# Patient Record
Sex: Male | Born: 1950 | Race: White | Hispanic: No | Marital: Married | Smoking: Never smoker
Health system: Southern US, Community
[De-identification: ages and names within clinical notes are randomized; demographics above are authoritative.]

## PROBLEM LIST (undated history)

## (undated) DIAGNOSIS — R339 Retention of urine, unspecified: Secondary | ICD-10-CM

## (undated) DIAGNOSIS — N35919 Unspecified urethral stricture, male, unspecified site: Secondary | ICD-10-CM

## (undated) DIAGNOSIS — R3912 Poor urinary stream: Secondary | ICD-10-CM

## (undated) DIAGNOSIS — R351 Nocturia: Secondary | ICD-10-CM

## (undated) HISTORY — PX: CYSTOSCOPY W/ INTERNAL URETHROTOMY: SUR376

---

## 2004-07-10 ENCOUNTER — Emergency Department (HOSPITAL_COMMUNITY): Admission: EM | Admit: 2004-07-10 | Discharge: 2004-07-10 | Payer: Self-pay | Admitting: Family Medicine

## 2006-07-22 ENCOUNTER — Encounter: Admission: RE | Admit: 2006-07-22 | Discharge: 2006-07-22 | Payer: Self-pay | Admitting: Urology

## 2006-07-24 ENCOUNTER — Ambulatory Visit (HOSPITAL_BASED_OUTPATIENT_CLINIC_OR_DEPARTMENT_OTHER): Admission: RE | Admit: 2006-07-24 | Discharge: 2006-07-24 | Payer: Self-pay | Admitting: Urology

## 2008-09-22 ENCOUNTER — Emergency Department (HOSPITAL_COMMUNITY): Admission: EM | Admit: 2008-09-22 | Discharge: 2008-09-22 | Payer: Self-pay | Admitting: Emergency Medicine

## 2008-12-03 ENCOUNTER — Emergency Department (HOSPITAL_COMMUNITY): Admission: EM | Admit: 2008-12-03 | Discharge: 2008-12-03 | Payer: Self-pay | Admitting: Emergency Medicine

## 2010-10-06 NOTE — Op Note (Signed)
NAME:  Darren Young, CHAVARIN NO.:  [id number]   MEDICAL RECORD NO.:  [id number]          PATIENT TYPE:  AMB   LOCATION:  [facility]                         FACILITY:  [facility]   PHYSICIAN:  Courtney Paris, M.D.DATE OF BIRTH:  07-04-1950   DATE OF PROCEDURE:  07/24/2006  DATE OF DISCHARGE:                               OPERATIVE REPORT   PREOPERATIVE DIAGNOSIS:  Urethral stricture - recurrent.   POSTOPERATIVE DIAGNOSIS:  Urethral stricture - recurrent.   OPERATION:  Direct visual internal urethrotomy.   ANESTHESIA:  General.   SURGEON:  Dr. Vic Blackbird.   BRIEF HISTORY:  This 60 year old patient is admitted with a recurrent  urethral stricture.  I did an internal urethrotomy and around 1996.  He  has had a slow stream in the past year. We are not sure exactly how and  why he got the stricture but it has responded well to the treatment he  had 10 years ago.  He has also had decreased erections for the last 6-12  months.  Cysto at the end of December showed a deep bulbous urethral  stricture and he enters now to have this incised at this time.   The patient was placed on the operating table in the dorsal lithotomy  position and after satisfactory induction of general anesthesia was  prepped and draped with Betadine and was given IV Ancef.  The 22 direct  vision urethrotome was then used to inspect the anterior urethra.  Pictures were made of the stricture in the deep bulbous urethra with a  #3 ureteral catheter going through it.  At 12 o'clock under direct  vision, the stricture was incised, it was about 1-1/2 to 2 cm in length  and went down to and close to the external sphincter.  The scope was  then inserted into the bladder. The bladder was carefully inspected and  it was at least 2+ trabeculated but had no bladder mucosal lesions seen.  The trigone and orifices looked normal.  Backing the scope out, I again  incised the stricture at 12 o'clock. There was very  little bleeding and  it seemed to be adequately incised and opened up.  Pictures were made of  the completion and the scope removed.  He voided with a good stream.  A  #20 Foley catheter easily went in without difficulty and was left to a  straight drainage bag.  Will plan to leave the catheter for 2 weeks and  will come back the day after he removes the catheter on the 20th for  further follow-up and treatment.      Courtney Paris, M.D.  Electronically Signed     HMK/MEDQ  D:  07/24/2006  T:  07/24/2006  Job:  [id number]

## 2012-08-14 ENCOUNTER — Other Ambulatory Visit: Payer: Self-pay | Admitting: Urology

## 2012-09-23 ENCOUNTER — Encounter (HOSPITAL_BASED_OUTPATIENT_CLINIC_OR_DEPARTMENT_OTHER): Payer: Self-pay | Admitting: *Deleted

## 2012-09-23 NOTE — Progress Notes (Signed)
NPO AFTER MN. ARRIVES AT 0715. NEEDS HG. 

## 2012-09-26 ENCOUNTER — Encounter (HOSPITAL_BASED_OUTPATIENT_CLINIC_OR_DEPARTMENT_OTHER): Payer: Self-pay | Admitting: *Deleted

## 2012-09-26 ENCOUNTER — Encounter (HOSPITAL_BASED_OUTPATIENT_CLINIC_OR_DEPARTMENT_OTHER)
Admission: RE | Disposition: A | Discharge status: Home / Self Care | Payer: Self-pay | Source: Ambulatory Visit | Attending: Urology

## 2012-09-26 ENCOUNTER — Ambulatory Visit (HOSPITAL_BASED_OUTPATIENT_CLINIC_OR_DEPARTMENT_OTHER)
Admission: RE | Admit: 2012-09-26 | Discharge: 2012-09-26 | Disposition: A | Discharge status: Home / Self Care | Payer: 59 | Source: Ambulatory Visit | Attending: Urology | Admitting: Urology

## 2012-09-26 ENCOUNTER — Ambulatory Visit (HOSPITAL_BASED_OUTPATIENT_CLINIC_OR_DEPARTMENT_OTHER): Payer: 59 | Admitting: Certified Registered"

## 2012-09-26 ENCOUNTER — Encounter (HOSPITAL_BASED_OUTPATIENT_CLINIC_OR_DEPARTMENT_OTHER): Payer: Self-pay | Admitting: Certified Registered"

## 2012-09-26 DIAGNOSIS — N35919 Unspecified urethral stricture, male, unspecified site: Secondary | ICD-10-CM | POA: Insufficient documentation

## 2012-09-26 DIAGNOSIS — R350 Frequency of micturition: Secondary | ICD-10-CM | POA: Insufficient documentation

## 2012-09-26 DIAGNOSIS — R339 Retention of urine, unspecified: Secondary | ICD-10-CM | POA: Insufficient documentation

## 2012-09-26 HISTORY — DX: Unspecified urethral stricture, male, unspecified site: N35.919

## 2012-09-26 HISTORY — DX: Retention of urine, unspecified: R33.9

## 2012-09-26 HISTORY — PX: CYSTOSCOPY WITH URETHRAL DILATATION: SHX5125

## 2012-09-26 HISTORY — DX: Poor urinary stream: R39.12

## 2012-09-26 HISTORY — DX: Nocturia: R35.1

## 2012-09-26 SURGERY — CYSTOSCOPY WITH RETROGRADE URETHROGRAM
Anesthesia: General | Site: Urethra | Wound class: Clean Contaminated

## 2012-09-26 MED ORDER — LIDOCAINE HCL (CARDIAC) 20 MG/ML IV SOLN
INTRAVENOUS | Status: DC | PRN
  Administered 2012-09-26: 60 mg via INTRAVENOUS

## 2012-09-26 MED ORDER — ONDANSETRON HCL 4 MG/2ML IJ SOLN
INTRAMUSCULAR | Status: DC | PRN
  Administered 2012-09-26: 4 mg via INTRAVENOUS

## 2012-09-26 MED ORDER — MIDAZOLAM HCL 5 MG/5ML IJ SOLN
INTRAMUSCULAR | Status: DC | PRN
  Administered 2012-09-26: 2 mg via INTRAVENOUS

## 2012-09-26 MED ORDER — FENTANYL CITRATE 0.05 MG/ML IJ SOLN
25.0000 ug | INTRAMUSCULAR | Status: DC | PRN
  Filled 2012-09-26: qty 1

## 2012-09-26 MED ORDER — PROPOFOL 10 MG/ML IV BOLUS
INTRAVENOUS | Status: DC | PRN
  Administered 2012-09-26: 200 mg via INTRAVENOUS

## 2012-09-26 MED ORDER — FENTANYL CITRATE 0.05 MG/ML IJ SOLN
INTRAMUSCULAR | Status: DC | PRN
  Administered 2012-09-26: 50 ug via INTRAVENOUS
  Administered 2012-09-26 (×2): 25 ug via INTRAVENOUS

## 2012-09-26 MED ORDER — PROMETHAZINE HCL 25 MG/ML IJ SOLN
6.2500 mg | INTRAMUSCULAR | Status: DC | PRN
  Filled 2012-09-26: qty 1

## 2012-09-26 MED ORDER — CEFAZOLIN SODIUM-DEXTROSE 2-3 GM-% IV SOLR
2.0000 g | INTRAVENOUS | Status: AC
  Administered 2012-09-26: 2 g via INTRAVENOUS
  Filled 2012-09-26: qty 50

## 2012-09-26 MED ORDER — DEXAMETHASONE SODIUM PHOSPHATE 4 MG/ML IJ SOLN
INTRAMUSCULAR | Status: DC | PRN
  Administered 2012-09-26: 8 mg via INTRAVENOUS

## 2012-09-26 MED ORDER — CEFAZOLIN SODIUM 1-5 GM-% IV SOLN
1.0000 g | INTRAVENOUS | Status: DC
  Filled 2012-09-26: qty 50

## 2012-09-26 MED ORDER — CEPHALEXIN 500 MG PO CAPS: 500.0000 mg | ORAL_CAPSULE | Freq: Every day | ORAL | Status: DC

## 2012-09-26 MED ORDER — DIATRIZOATE MEGLUMINE 30 % UR SOLN
URETHRAL | Status: DC | PRN
  Administered 2012-09-26: 200 mL via URETHRAL

## 2012-09-26 MED ORDER — STERILE WATER FOR IRRIGATION IR SOLN
Status: DC | PRN
  Administered 2012-09-26: 3000 mL

## 2012-09-26 MED ORDER — LACTATED RINGERS IV SOLN
INTRAVENOUS | Status: DC
  Filled 2012-09-26: qty 1000

## 2012-09-26 MED ORDER — LACTATED RINGERS IV SOLN
INTRAVENOUS | Status: DC
  Administered 2012-09-26: 08:00:00 via INTRAVENOUS
  Filled 2012-09-26: qty 1000

## 2012-09-26 MED ORDER — PHENAZOPYRIDINE HCL 100 MG PO TABS: 100.0000 mg | ORAL_TABLET | Freq: Three times a day (TID) | ORAL | Status: DC | PRN

## 2012-09-26 SURGICAL SUPPLY — 25 items
BAG DRAIN URO-CYSTO SKYTR STRL (DRAIN) ×2 IMPLANT
BAG DRN ANRFLXCHMBR STRAP LEK (BAG) ×1
BAG DRN UROCATH (DRAIN) ×1
BAG URINE LEG 19OZ MD ST LTX (BAG) ×1 IMPLANT
BRUSH URET BIOPSY 3F (UROLOGICAL SUPPLIES) IMPLANT
CANISTER SUCT LVC 12 LTR MEDI- (MISCELLANEOUS) ×2 IMPLANT
CATH FOLEY 2WAY SLVR  5CC 16FR (CATHETERS)
CATH FOLEY 2WAY SLVR  5CC 20FR (CATHETERS) ×1
CATH FOLEY 2WAY SLVR 5CC 16FR (CATHETERS) IMPLANT
CATH FOLEY 2WAY SLVR 5CC 20FR (CATHETERS) ×1 IMPLANT
CLOTH BEACON ORANGE TIMEOUT ST (SAFETY) ×2 IMPLANT
DRAPE CAMERA CLOSED 9X96 (DRAPES) ×2 IMPLANT
ELECT REM PT RETURN 9FT ADLT (ELECTROSURGICAL)
ELECTRODE REM PT RTRN 9FT ADLT (ELECTROSURGICAL) IMPLANT
GLOVE BIO SURGEON STRL SZ7.5 (GLOVE) ×2 IMPLANT
GLOVE BIOGEL PI IND STRL 6.5 (GLOVE) ×2 IMPLANT
GLOVE BIOGEL PI IND STRL 7.0 (GLOVE) IMPLANT
GLOVE BIOGEL PI INDICATOR 6.5 (GLOVE) ×2
GLOVE BIOGEL PI INDICATOR 7.0 (GLOVE) ×2
GOWN STRL REIN XL XLG (GOWN DISPOSABLE) ×2 IMPLANT
GUIDEWIRE STR DUAL SENSOR (WIRE) IMPLANT
HOLDER FOLEY CATH W/STRAP (MISCELLANEOUS) ×1 IMPLANT
KIT BALLN UROMAX 15FX4 (MISCELLANEOUS) IMPLANT
KIT BALLN UROMAX 26 75X4 (MISCELLANEOUS) ×1
PACK CYSTOSCOPY (CUSTOM PROCEDURE TRAY) ×2 IMPLANT

## 2012-09-26 NOTE — Transfer of Care (Signed)
Immediate Anesthesia Transfer of Care Note  Patient: Darren Young  Procedure(s) Performed: Procedure(s): CYSTOSCOPY WITH RETROGRADE URETHROGRAM (N/A) CYSTOSCOPY WITH URETHRAL DILATATION (N/A)  Patient Location: PACU  Anesthesia Type:General  Level of Consciousness: awake and alert   Airway & Oxygen Therapy: Patient Spontanous Breathing and Patient connected to face mask oxygen  Post-op Assessment: Report given to PACU RN and Post -op Vital signs reviewed and stable  Post vital signs: Reviewed and stable  Complications: No apparent anesthesia complications

## 2012-09-26 NOTE — Anesthesia Procedure Notes (Signed)
Procedure Name: LMA Insertion Date/Time: 09/26/2012 8:35 AM Performed by: Renella Cunas D Pre-anesthesia Checklist: Patient identified, Emergency Drugs available, Suction available and Patient being monitored Patient Re-evaluated:Patient Re-evaluated prior to inductionOxygen Delivery Method: Circle System Utilized Preoxygenation: Pre-oxygenation with 100% oxygen Intubation Type: IV induction Ventilation: Mask ventilation without difficulty LMA: LMA inserted LMA Size: 5.0 Number of attempts: 1 Airway Equipment and Method: bite block Placement Confirmation: positive ETCO2 Tube secured with: Tape Dental Injury: Teeth and Oropharynx as per pre-operative assessment

## 2012-09-26 NOTE — H&P (Signed)
  H&P  History of Present Illness:  Pt with urinary frequency, weak stream and elevated PVR -- known urethral stricture. Presents for cystoscopy, urethral dilation, cystoscopy. He continues to have weak stream and frequency.   He's had no issue with dysuria, hematuria or fever.   Past Medical History  Diagnosis Date  . Urethral stricture RECURRENT  . Incomplete bladder emptying   . Nocturia   . Weak urinary stream    Past Surgical History  Procedure Laterality Date  . Cystoscopy w/ internal urethrotomy  07-24-2006  &  1996  DR [facility]    DIRECT VISUAL    Home Medications:  Prescriptions prior to admission  Medication Sig Dispense Refill  . diphenhydrAMINE (BENADRYL) 25 MG tablet Take 25 mg by mouth at bedtime as needed for itching.       Allergies:  Allergies  Allergen Reactions  . Ciprofloxacin Rash    History reviewed. No pertinent family history. Social History:  reports that he has never smoked. He has never used smokeless tobacco. He reports that he does not drink alcohol or use illicit drugs.  ROS: A complete review of systems was performed.  All systems are negative except for pertinent findings as noted. @ROS @   Physical Exam:  Vital signs in last 24 hours: Temp:  [97.8 F (36.6 C)] 97.8 F (36.6 C) (05/09 0718) Pulse Rate:  [79] 79 (05/09 0718) Resp:  [18] 18 (05/09 0718) BP: (116)/(76) 116/76 mmHg (05/09 0718) SpO2:  [99 %] 99 % (05/09 0718) Weight:  [89.812 kg (198 lb)] 89.812 kg (198 lb) (05/09 0718) General:  Alert and oriented, No acute distress HEENT: Normocephalic, atraumatic Neck: No JVD or lymphadenopathy Cardiovascular: Regular rate and rhythm Lungs: Regular rate and effort Abdomen: Soft, nontender, nondistended, no abdominal masses Back: No CVA tenderness Extremities: No edema Neurologic: Grossly intact  Laboratory Data:  Results for orders placed during the hospital encounter of 09/26/12 (from the past 24 hour(s))  POCT  HEMOGLOBIN-HEMACUE     Status: None   Collection Time    09/26/12  7:58 AM      Result Value Range   Hemoglobin 13.6  13.0 - 17.0 g/dL    Impression/Assessment:  Urethral stricture Incomplete emptying Urinary frequency   Plan:  I discussed with the patient the nature, potential benefits, risks and alternatives to cystoscopy with balloon dilation, including side effects of the proposed treatment, the likelihood of the patient achieving the goals of the procedure, and any potential problems that might occur during the procedure or recuperation. We discussed initial success with dilation is good but long term success lower and recurrence likely. We discussed flow symptoms, emptying and frequency may improve following urethral stricture dilation, but they may persist or worsen. We discussed the role of the bladder and the outlet (prostate, urethra) in urine storage and emptying. We discussed the bladder and/or prostate may also be involved. We discussed he may need Urodynamics and further treatment depending on results how he fares after stricture dilation. All questions answered. Patient elects to proceed.   Antony Haste 09/26/2012, 8:30 AM

## 2012-09-26 NOTE — Op Note (Signed)
Preoperative diagnosis: Urethral stricture, elevated post void residual, urinary frequency and weak stream Postoperative diagnosis: Same  Procedure: Exam under anesthesia Cystoscopy Retrograde urethrogram Balloon dilation urethral stricture Foley catheter placement  Surgeon: Rajat Staver Type of anesthesia: Gen.  Findings: On exam under anesthesia the penis was circumcised without mass or lesion. Testicles were descended and palpably normal. On digital rectal exam the prostate was normal size without significant enlargement and no hard area or nodule.  Retrograde urethrogram there was narrowing of the urethra in the mid to distal bulb of a centimeter or less.  Cystoscopy - there was a stricture in the distal bulbar urethra of about 6 [place] and a 1 cm in length. After dilation proximal to this the prostate showed mild lateral lobe hypertrophy without visual obstruction, the bladder showed mild to moderate trabeculation but the mucosa was normal without tumor or lesion. There were no stones or foreign bodies in the bladder.  Description of procedure: After consent was obtained patient brought to the operating room. After adequate anesthesia he was placed in lithotomy position and prepped and draped in the usual fashion. A timeout was performed to confirm the patient and procedure. An exam under anesthesia was performed. After a glove change cystoscope was passed per urethra the stricture was visualized and photographed. The scope was removed and a 20 [place] Foley catheter placed in the distal urethra and the balloon inflated with 2 cc. A retrograde urethrogram was obtained with retrograde injection of contrast which indicated a short stricture. the Foley was removed and the scope replaced and a sensor wire was advanced through the stricture and coiled in the bladder under fluoroscopic guidance. A 4 cm 15 French balloon was then advanced through the stricture inflated to 18 atmospheres and left for 3  minutes. The balloon was deflated and removed. A stricture again appeared short and a 22 [place] scope passed rather easily now into the bladder. Again these findings were normal.  The wire was backloaded on a 20 French Foley catheter with an 18-gauge needle and then passed as a council tip catheter into the bladder. The wire was removed. A cystogram confirm proper Foley placement. This was left gravity drainage draining clear urine.  Complications: None  Blood loss: Minimal  Drains: 20 French Foley  Disposition: Patient stable to PACU

## 2012-09-26 NOTE — Anesthesia Preprocedure Evaluation (Signed)
Anesthesia Evaluation  Patient identified by MRN, date of birth, ID band Patient awake    Reviewed: Allergy & Precautions, H&P , NPO status , Patient's Chart, lab work & pertinent test results  Airway Mallampati: II TM Distance: >3 FB Neck ROM: Full    Dental  (+) Teeth Intact, Caps and Dental Advisory Given   Pulmonary neg pulmonary ROS,  breath sounds clear to auscultation  Pulmonary exam normal       Cardiovascular negative cardio ROS  Rhythm:Regular Rate:Normal     Neuro/Psych negative neurological ROS  negative psych ROS   GI/Hepatic negative GI ROS, Neg liver ROS,   Endo/Other  negative endocrine ROS  Renal/GU negative Renal ROS  negative genitourinary   Musculoskeletal negative musculoskeletal ROS (+)   Abdominal   Peds  Hematology negative hematology ROS (+)   Anesthesia Other Findings Caps upper incisors  Reproductive/Obstetrics negative OB ROS                           Anesthesia Physical Anesthesia Plan  ASA: I  Anesthesia Plan: General   Post-op Pain Management:    Induction: Intravenous  Airway Management Planned: LMA  Additional Equipment:   Intra-op Plan:   Post-operative Plan: Extubation in OR  Informed Consent: I have reviewed the patients History and Physical, chart, labs and discussed the procedure including the risks, benefits and alternatives for the proposed anesthesia with the patient or authorized representative who has indicated his/her understanding and acceptance.   Dental advisory given  Plan Discussed with: CRNA  Anesthesia Plan Comments:         Anesthesia Quick Evaluation

## 2012-09-27 NOTE — Anesthesia Postprocedure Evaluation (Signed)
Anesthesia Post Note  Patient: Darren Young  Procedure(s) Performed: Procedure(s) (LRB): CYSTOSCOPY WITH RETROGRADE URETHROGRAM (N/A) CYSTOSCOPY WITH URETHRAL DILATATION (N/A)  Anesthesia type: General  Patient location: PACU  Post pain: Pain level controlled  Post assessment: Post-op Vital signs reviewed  Last Vitals:  Filed Vitals:   09/26/12 1054  BP: 119/78  Pulse: 68  Temp: 36.1 C  Resp: 16    Post vital signs: Reviewed  Level of consciousness: sedated  Complications: No apparent anesthesia complications

## 2012-09-29 ENCOUNTER — Encounter (HOSPITAL_BASED_OUTPATIENT_CLINIC_OR_DEPARTMENT_OTHER): Payer: Self-pay | Admitting: Urology

## 2013-03-30 ENCOUNTER — Other Ambulatory Visit: Payer: Self-pay | Admitting: Urology

## 2013-04-21 ENCOUNTER — Encounter (HOSPITAL_BASED_OUTPATIENT_CLINIC_OR_DEPARTMENT_OTHER): Payer: Self-pay | Admitting: *Deleted

## 2013-04-21 NOTE — Progress Notes (Signed)
NPO AFTER MN. ARRIVE AT 0715. NEEDS HG AND UA AND CULTURE.

## 2013-04-24 ENCOUNTER — Ambulatory Visit (HOSPITAL_BASED_OUTPATIENT_CLINIC_OR_DEPARTMENT_OTHER)
Admission: RE | Admit: 2013-04-24 | Discharge: 2013-04-24 | Disposition: A | Discharge status: Home / Self Care | Payer: 59 | Source: Ambulatory Visit | Attending: Urology | Admitting: Urology

## 2013-04-24 ENCOUNTER — Encounter (HOSPITAL_BASED_OUTPATIENT_CLINIC_OR_DEPARTMENT_OTHER): Payer: Self-pay | Admitting: *Deleted

## 2013-04-24 ENCOUNTER — Encounter (HOSPITAL_BASED_OUTPATIENT_CLINIC_OR_DEPARTMENT_OTHER)
Admission: RE | Disposition: A | Discharge status: Home / Self Care | Payer: Self-pay | Source: Ambulatory Visit | Attending: Urology

## 2013-04-24 ENCOUNTER — Encounter (HOSPITAL_BASED_OUTPATIENT_CLINIC_OR_DEPARTMENT_OTHER): Payer: 59 | Admitting: Anesthesiology

## 2013-04-24 ENCOUNTER — Ambulatory Visit (HOSPITAL_BASED_OUTPATIENT_CLINIC_OR_DEPARTMENT_OTHER): Payer: 59 | Admitting: Anesthesiology

## 2013-04-24 DIAGNOSIS — N35919 Unspecified urethral stricture, male, unspecified site: Secondary | ICD-10-CM | POA: Insufficient documentation

## 2013-04-24 DIAGNOSIS — R339 Retention of urine, unspecified: Secondary | ICD-10-CM | POA: Insufficient documentation

## 2013-04-24 HISTORY — PX: HOLMIUM LASER APPLICATION: SHX5852

## 2013-04-24 HISTORY — PX: CYSTOSCOPY WITH URETHRAL DILATATION: SHX5125

## 2013-04-24 LAB — POCT HEMOGLOBIN-HEMACUE: Hemoglobin: 14.9 g/dL (ref 13.0–17.0)

## 2013-04-24 SURGERY — CYSTOSCOPY, WITH URETHRAL DILATION
Anesthesia: General | Site: Urethra

## 2013-04-24 MED ORDER — CEFAZOLIN SODIUM 1-5 GM-% IV SOLN
1.0000 g | INTRAVENOUS | Status: DC
  Filled 2013-04-24: qty 50

## 2013-04-24 MED ORDER — FENTANYL CITRATE 0.05 MG/ML IJ SOLN
INTRAMUSCULAR | Status: DC | PRN
  Administered 2013-04-24: 25 ug via INTRAVENOUS
  Administered 2013-04-24: 50 ug via INTRAVENOUS
  Administered 2013-04-24: 25 ug via INTRAVENOUS

## 2013-04-24 MED ORDER — LACTATED RINGERS IV SOLN
INTRAVENOUS | Status: DC
  Administered 2013-04-24: 08:00:00 via INTRAVENOUS
  Filled 2013-04-24: qty 1000

## 2013-04-24 MED ORDER — KETOROLAC TROMETHAMINE 30 MG/ML IJ SOLN
INTRAMUSCULAR | Status: DC | PRN
  Administered 2013-04-24: 30 mg via INTRAVENOUS

## 2013-04-24 MED ORDER — NITROFURANTOIN MONOHYD MACRO 100 MG PO CAPS: 100.0000 mg | ORAL_CAPSULE | Freq: Every day | ORAL | Status: DC

## 2013-04-24 MED ORDER — FENTANYL CITRATE 0.05 MG/ML IJ SOLN
INTRAMUSCULAR | Status: AC
  Filled 2013-04-24: qty 4

## 2013-04-24 MED ORDER — FENTANYL CITRATE 0.05 MG/ML IJ SOLN
25.0000 ug | INTRAMUSCULAR | Status: DC | PRN
  Filled 2013-04-24: qty 1

## 2013-04-24 MED ORDER — PROMETHAZINE HCL 25 MG/ML IJ SOLN
6.2500 mg | INTRAMUSCULAR | Status: DC | PRN
  Filled 2013-04-24: qty 1

## 2013-04-24 MED ORDER — CEFAZOLIN SODIUM-DEXTROSE 2-3 GM-% IV SOLR
2.0000 g | INTRAVENOUS | Status: AC
  Administered 2013-04-24: 2 g via INTRAVENOUS
  Filled 2013-04-24: qty 50

## 2013-04-24 MED ORDER — URIBEL 118 MG PO CAPS: 1.0000 | ORAL_CAPSULE | Freq: Four times a day (QID) | ORAL | Status: DC | PRN

## 2013-04-24 MED ORDER — PROPOFOL 10 MG/ML IV BOLUS
INTRAVENOUS | Status: DC | PRN
  Administered 2013-04-24: 200 mg via INTRAVENOUS

## 2013-04-24 MED ORDER — MIDAZOLAM HCL 5 MG/5ML IJ SOLN
INTRAMUSCULAR | Status: DC | PRN
  Administered 2013-04-24: 2 mg via INTRAVENOUS

## 2013-04-24 MED ORDER — LACTATED RINGERS IV SOLN
INTRAVENOUS | Status: DC
  Filled 2013-04-24: qty 1000

## 2013-04-24 MED ORDER — STERILE WATER FOR IRRIGATION IR SOLN
Status: DC | PRN
  Administered 2013-04-24: 500 mL

## 2013-04-24 MED ORDER — MIDAZOLAM HCL 2 MG/2ML IJ SOLN
INTRAMUSCULAR | Status: AC
  Filled 2013-04-24: qty 2

## 2013-04-24 MED ORDER — SODIUM CHLORIDE 0.9 % IR SOLN
Status: DC | PRN
  Administered 2013-04-24: 5000 mL

## 2013-04-24 MED ORDER — LIDOCAINE HCL (CARDIAC) 20 MG/ML IV SOLN
INTRAVENOUS | Status: DC | PRN
  Administered 2013-04-24: 60 mg via INTRAVENOUS

## 2013-04-24 MED ORDER — ONDANSETRON HCL 4 MG/2ML IJ SOLN
INTRAMUSCULAR | Status: DC | PRN
  Administered 2013-04-24: 4 mg via INTRAVENOUS

## 2013-04-24 MED ORDER — DEXAMETHASONE SODIUM PHOSPHATE 4 MG/ML IJ SOLN
INTRAMUSCULAR | Status: DC | PRN
  Administered 2013-04-24: 10 mg via INTRAVENOUS

## 2013-04-24 SURGICAL SUPPLY — 44 items
ADAPTER CATH URET PLST 4-6FR (CATHETERS) IMPLANT
ADPR CATH URET STRL DISP 4-6FR (CATHETERS)
BAG DRAIN URO-CYSTO SKYTR STRL (DRAIN) ×2 IMPLANT
BAG DRN UROCATH (DRAIN) ×1
BALLN NEPHROSTOMY (BALLOONS)
BALLOON NEPHROSTOMY (BALLOONS) IMPLANT
BASKET LASER NITINOL 1.9FR (BASKET) IMPLANT
BASKET STNLS GEMINI 4WIRE 3FR (BASKET) IMPLANT
BASKET ZERO TIP NITINOL 2.4FR (BASKET) IMPLANT
BRUSH URET BIOPSY 3F (UROLOGICAL SUPPLIES) IMPLANT
BSKT STON RTRVL 120 1.9FR (BASKET)
BSKT STON RTRVL GEM 120X11 3FR (BASKET)
BSKT STON RTRVL ZERO TP 2.4FR (BASKET)
CANISTER SUCT LVC 12 LTR MEDI- (MISCELLANEOUS) IMPLANT
CATH FOLEY 2W COUNCIL 5CC 18FR (CATHETERS) ×2 IMPLANT
CATH FOLEY 2WAY SLVR  5CC 18FR (CATHETERS) ×1
CATH FOLEY 2WAY SLVR 5CC 18FR (CATHETERS) IMPLANT
CATH INTERMIT  6FR 70CM (CATHETERS) IMPLANT
CATH URET 5FR 28IN CONE TIP (BALLOONS)
CATH URET 5FR 28IN OPEN ENDED (CATHETERS) IMPLANT
CATH URET 5FR 70CM CONE TIP (BALLOONS) IMPLANT
CLOTH BEACON ORANGE TIMEOUT ST (SAFETY) ×2 IMPLANT
DRAPE CAMERA CLOSED 9X96 (DRAPES) ×2 IMPLANT
ELECT REM PT RETURN 9FT ADLT (ELECTROSURGICAL)
ELECTRODE REM PT RTRN 9FT ADLT (ELECTROSURGICAL) IMPLANT
FIBER LASER FLEXIVA 1000 (UROLOGICAL SUPPLIES) ×1 IMPLANT
FIBER LASER FLEXIVA 550 (UROLOGICAL SUPPLIES) ×2 IMPLANT
GLOVE BIO SURGEON STRL SZ7 (GLOVE) ×1 IMPLANT
GLOVE BIO SURGEON STRL SZ7.5 (GLOVE) ×2 IMPLANT
GLOVE INDICATOR 7.0 STRL GRN (GLOVE) ×2 IMPLANT
GOWN PREVENTION PLUS LG XLONG (DISPOSABLE) ×2 IMPLANT
GOWN STRL REIN XL XLG (GOWN DISPOSABLE) ×2 IMPLANT
GUIDEWIRE 0.038 PTFE COATED (WIRE) ×2 IMPLANT
GUIDEWIRE ANG ZIPWIRE 038X150 (WIRE) IMPLANT
GUIDEWIRE STR DUAL SENSOR (WIRE) ×2 IMPLANT
IV NS IRRIG 3000ML ARTHROMATIC (IV SOLUTION) ×4 IMPLANT
KIT BALLIN UROMAX 15FX10 (LABEL) IMPLANT
KIT BALLN UROMAX 15FX4 (MISCELLANEOUS) IMPLANT
KIT BALLN UROMAX 26 75X4 (MISCELLANEOUS)
PACK CYSTOSCOPY (CUSTOM PROCEDURE TRAY) ×2 IMPLANT
SET HIGH PRES BAL DIL (LABEL)
SHEATH URET ACCESS 12FR/35CM (UROLOGICAL SUPPLIES) IMPLANT
SHEATH URET ACCESS 12FR/55CM (UROLOGICAL SUPPLIES) IMPLANT
SYRINGE IRR TOOMEY STRL 70CC (SYRINGE) IMPLANT

## 2013-04-24 NOTE — Anesthesia Postprocedure Evaluation (Signed)
Anesthesia Post Note  Patient: Darren Young  Procedure(s) Performed: Procedure(s) (LRB): CYSTOSCOPY WITH HOLMIUM LASER OF URETHRAL STRICTURE (N/A) HOLMIUM LASER APPLICATION (N/A)  Anesthesia type: General  Patient location: PACU  Post pain: Pain level controlled  Post assessment: Post-op Vital signs reviewed  Last Vitals:  Filed Vitals:   04/24/13 1030  BP: 116/76  Pulse: 74  Temp:   Resp: 17    Post vital signs: Reviewed  Level of consciousness: sedated  Complications: No apparent anesthesia complications

## 2013-04-24 NOTE — H&P (Signed)
  H&P  History of Present Illness: Pt presents for cystoscopy, holmium laser ablation of urethral stricture. He has been well with no dysuria or hematuria. He has a weak stream, double voiding, frequency, feelings of incomplete emptying.   He underwent balloon dilation May 2014, but has recurrence on office cysto Sept 2014 with rising PVR.     Past Medical History  Diagnosis Date  . Urethral stricture RECURRENT  . Incomplete bladder emptying   . Nocturia   . Weak urinary stream    Past Surgical History  Procedure Laterality Date  . Cystoscopy w/ internal urethrotomy  07-24-2006  &  1996  DR [facility]    DIRECT VISUAL  . Cystoscopy with urethral dilatation N/A 09/26/2012    Procedure: CYSTOSCOPY WITH URETHRAL DILATATION;  Surgeon: Antony Haste, MD;  Location: [facility];  Service: Urology;  Laterality: N/A;    Home Medications:  Prescriptions prior to admission  Medication Sig Dispense Refill  . diphenhydrAMINE (BENADRYL) 25 MG tablet Take 25 mg by mouth at bedtime as needed for itching.       Allergies:  Allergies  Allergen Reactions  . Ciprofloxacin Rash    History reviewed. No pertinent family history. Social History:  reports that he has never smoked. He has never used smokeless tobacco. He reports that he does not drink alcohol or use illicit drugs.  ROS: A complete review of systems was performed.  All systems are negative except for pertinent findings as noted. ROS   Physical Exam:  Vital signs in last 24 hours: Temp:  [97.6 F (36.4 C)] 97.6 F (36.4 C) (12/05 0728) Pulse Rate:  [72] 72 (12/05 0728) Resp:  [14] 14 (12/05 0728) BP: (114)/(79) 114/79 mmHg (12/05 0728) SpO2:  [97 %] 97 % (12/05 0728) Weight:  [94.121 kg (207 lb 8 oz)] 94.121 kg (207 lb 8 oz) (12/05 0728) General:  Alert and oriented, No acute distress HEENT: Normocephalic, atraumatic Neck: No JVD or lymphadenopathy Abdomen: Soft, nontender, nondistended, no abdominal  masses Back: No CVA tenderness Extremities: No edema Neurologic: Grossly intact  Laboratory Data:  No results found for this or any previous visit (from the past 24 hour(s)). No results found for this or any previous visit (from the past 240 hour(s)). Creatinine: No results found for this basename: CREATININE,  in the last 168 hours  Impression/Assessment:  Urethral stricture Incomplete bladder emptying  Plan:  I discussed with the patient and his wife the nature, potential benefits, risks and alternatives to cystoscopy/laser ablation stricture, including side effects of the proposed treatment, the likelihood of the patient achieving the goals of the procedure, and any potential problems that might occur during the procedure or recuperation. We discussed nature, R/B of UroLume, balloon dilation, CIC and formal urethroplasty as alternatives. We discussed again good initial success rates for minimally invasive approaches but poor long term success. We also discussed he may have a hypotonic bladder given the many years he may have been voiding with high pressure against an obstructing stricture. All questions answered. Patient elects to proceed as with cysto/laser application.   Antony Haste 04/24/2013, 8:52 AM

## 2013-04-24 NOTE — Anesthesia Preprocedure Evaluation (Addendum)
Anesthesia Evaluation  Patient identified by MRN, date of birth, ID band Patient awake    Reviewed: Allergy & Precautions, H&P , NPO status , Patient's Chart, lab work & pertinent test results  Airway Mallampati: II TM Distance: >3 FB Neck ROM: Full    Dental  (+) Teeth Intact, Caps and Dental Advisory Given   Pulmonary neg pulmonary ROS,  breath sounds clear to auscultation  Pulmonary exam normal       Cardiovascular negative cardio ROS  Rhythm:Regular Rate:Normal     Neuro/Psych negative neurological ROS  negative psych ROS   GI/Hepatic negative GI ROS, Neg liver ROS,   Endo/Other  negative endocrine ROS  Renal/GU negative Renal ROS  negative genitourinary   Musculoskeletal negative musculoskeletal ROS (+)   Abdominal   Peds  Hematology negative hematology ROS (+)   Anesthesia Other Findings Caps upper incisors  Reproductive/Obstetrics                           Anesthesia Physical Anesthesia Plan  ASA: I  Anesthesia Plan: General   Post-op Pain Management:    Induction: Intravenous  Airway Management Planned: LMA  Additional Equipment:   Intra-op Plan:   Post-operative Plan: Extubation in OR  Informed Consent: I have reviewed the patients History and Physical, chart, labs and discussed the procedure including the risks, benefits and alternatives for the proposed anesthesia with the patient or authorized representative who has indicated his/her understanding and acceptance.   Dental advisory given  Plan Discussed with: CRNA  Anesthesia Plan Comments:         Anesthesia Quick Evaluation

## 2013-04-24 NOTE — Anesthesia Procedure Notes (Signed)
Procedure Name: LMA Insertion Date/Time: 04/24/2013 8:57 AM Performed by: Renella Cunas D Pre-anesthesia Checklist: Patient identified, Emergency Drugs available, Suction available and Patient being monitored Patient Re-evaluated:Patient Re-evaluated prior to inductionOxygen Delivery Method: Circle System Utilized Preoxygenation: Pre-oxygenation with 100% oxygen Intubation Type: IV induction Ventilation: Mask ventilation without difficulty LMA: LMA inserted LMA Size: 5.0 Number of attempts: 1 Airway Equipment and Method: bite block Placement Confirmation: positive ETCO2 Tube secured with: Tape Dental Injury: Teeth and Oropharynx as per pre-operative assessment

## 2013-04-24 NOTE — Op Note (Signed)
Preoperative diagnosis: Urethral stricture, incomplete bladder emptying Postoperative diagnosis: Same  Procedure: Cystoscopy Direct visual internal urethrotomy with laser and laser ablation of scar tissue  Surgeon: Mena Goes Anesthesia: Gen.  Findings: Dense stricture in the bulbar urethra  Description of procedure: After consent was obtained patient brought to the operating room. After adequate anesthesia the patient was placed in lithotomy position and prepped and draped in the usual sterile fashion. A timeout was performed to confirm the patient and procedure. Cystoscope was passed per urethra and a dense stricture was noted. It was cannulated with a 6 [place] open-ended catheter which past without room to spare. A sensor wire was then advanced and confirmed under fluoroscopy to coil in the region of the bladder. The 6 [place] open-ended catheter was removed leaving the wire in place.  A 600  laser fiber was deployed and starting at 9:00 from my view the scar tissue was incised and ablated. This was carried around circumferentially which allowed the urethra to spring open. A 500  laser fiber was used at a setting of 1 and 15 but the tip was worn down. Therefore thousand microns laser fiber was passed and using the same settings. There remained a line of scar tissue at the 3:00 position or on the patient's left which was dense. The thousand micron laser fiber tip also or down. Lastly a 500  fiber was passed again and this tissue at the 3:00 position was incised but I could not completely ablate it. The scar tissue was ablated and an incision was made down into the spongy tissue. Again the laser fiber tip wore down. The 62 [place] scope easily passed now into the proximal urethra which appeared normal but somewhat dilated.  The prostatic urethra appeared normal but slightly dilated. The bladder visualization was cloudy But no foreign bodies or stones were noted. The mucosa appeared normal. There was  moderate trabeculation of the bladder. The wire was confirmed to be in the bladder lumen.  The scope was removed and an 56 [place] council tip was passed over the wire the balloon was inflated and seated at the bladder neck. The catheter was left to gravity drainage. Patient was awakened and taken to recovery room in stable condition.    complication : None Blood loss: Minimal  Specimens: None   Drains: 18 French Foley   Disposition: Patient stable to PACU

## 2013-04-24 NOTE — Transfer of Care (Signed)
Immediate Anesthesia Transfer of Care Note  Patient: Darren Young  Procedure(s) Performed: Procedure(s) (LRB): CYSTOSCOPY WITH HOLMIUM LASER OF URETHRAL STRICTURE (N/A) HOLMIUM LASER APPLICATION (N/A)  Patient Location: PACU  Anesthesia Type: General  Level of Consciousness: awake, oriented, sedated and patient cooperative  Airway & Oxygen Therapy: Patient Spontanous Breathing and Patient connected to face mask oxygen  Post-op Assessment: Report given to PACU RN and Post -op Vital signs reviewed and stable  Post vital signs: Reviewed and stable  Complications: No apparent anesthesia complications

## 2013-04-27 ENCOUNTER — Encounter (HOSPITAL_BASED_OUTPATIENT_CLINIC_OR_DEPARTMENT_OTHER): Payer: Self-pay | Admitting: Urology

## 2014-01-07 ENCOUNTER — Encounter: Payer: Self-pay | Admitting: Family Medicine

## 2014-01-07 ENCOUNTER — Ambulatory Visit (INDEPENDENT_AMBULATORY_CARE_PROVIDER_SITE_OTHER): Payer: 59 | Admitting: Family Medicine

## 2014-01-07 VITALS — Ht 72.0 in | Wt 220.6 lb

## 2014-01-07 DIAGNOSIS — E663 Overweight: Secondary | ICD-10-CM

## 2014-01-07 NOTE — Patient Instructions (Signed)
-   Eat at least 3 meals and 1-2 snacks per day.  Aim for no more than 5 hours between eating.  Eat breakfast within one hour of getting up.  - Obtain twice as many veg's as protein or carbohydrate foods for both lunch and dinner.  - Lunch or dinner is INCOMPLETE without vegetables.    - Consider ways you can increase vegetables with any recipe.    - Keep some frozen veg's on hand at all times.   - Walk at least 45 minutes 4 X week.    - Bring your GOALS SHEET to follow-up.

## 2014-01-07 NOTE — Progress Notes (Signed)
Medical Nutrition Therapy:  Appt start time: 2229 end time:  1630.  Assessment:  Primary concerns today: Weight management.  Mr. Ingham Eulas Post) would like to lose weight.  He stopped using any alcohol a few yrs ago, and started going to the gym regularly; lost 75 lb by 2013, but has gained ~20 lb back.  He has usually done well losing weight if he eats high-pro, low-carb.   Urethral problems have created barriers to exercise (b/c of surgeries in past yr and discomfort w/ exercise), but he believes this is surmountable, and seems highly motivated at this time.   He usually does pretty well keeping chips out of the house, but finds it hard to resist if they are available.   Learning Readiness: Change in progress; has started walking.    Barriers to learning/adherence to lifestyle change: lack of accountability in recent past.  Eulas Post is recording food intake using [employer] site.    Usual eating pattern includes 2-3 meals and 1-2 snacks per day. Usual physical activity includes walking 45 min 2-4 X wk with his wife.  Frequent foods include rotisserie chx, Slim-fast for lunch daily, bkfst of Cheerios or Raisin Bran, 2% milk, 5 c coffee w/ Swt 'Low, creamer.  Avoided foods include none.    24-hr recall: (Up at 6 AM) Snk (6:30 AM)- 2 c coffee w/ 1 Swt 'Low, 1/2 tbsp creamer per cup Snk (7:30 AM)- 3 c coffee w/ 1 Swt 'Low, 1/2 tbsp creamer per cup B (9 AM)-   1 c Sp K Fruit & Yogurt, 2% milk Snk ( AM)-    L (11:30 AM)-  Slim Fast (220kcal), 10 saltines w/ 5 tbsp pb, diet Coke Snk (2 PM)-  Light 'n Lively Yogurt (80 kcal), diet Coke D (6:30 PM)-  1 large salad, chx breast, balsamic dressing Snk ( PM)-   Yesterday was a pretty typical day.    Progress Towards Goal(s):  In progress.   Nutritional Diagnosis:  [place]-3.3 Overweight/obesity As related to inadequate exercise and inattention to diet.  As evidenced by BMI of 29.    Intervention:  Nutrition education.  Handouts given  during visit include:  AVS  Goals Sheet  Demonstrated degree of understanding via:  Teach Back   Monitoring/Evaluation:  Dietary intake, exercise, and body weight in 7 week(s).  No appts available before.

## 2014-02-25 ENCOUNTER — Encounter: Payer: Self-pay | Admitting: Family Medicine

## 2014-02-25 ENCOUNTER — Ambulatory Visit (INDEPENDENT_AMBULATORY_CARE_PROVIDER_SITE_OTHER): Payer: 59 | Admitting: Family Medicine

## 2014-02-25 VITALS — Ht 72.0 in | Wt 214.8 lb

## 2014-02-25 DIAGNOSIS — E663 Overweight: Secondary | ICD-10-CM

## 2014-02-25 NOTE — Progress Notes (Signed)
Medical Nutrition Therapy:  Appt start time: 3888 end time:  1630.  Assessment:  Primary concerns today: Weight management.  Mr. [name]) has been walking/elliptical/biking 45-60 min 4-5 X wk.  He is getting 3 meals a day on most days.  He has sometimes gotten up too late for breakfast.  He is getting veg's twice a day on most days.  Always getting cooked veg's at dinner, and usually salad at lunch time.  Most vulnerable eating time is when he gets home from work in the afternoon, but he usually makes mindful choices such as a piece of cheese, and otherwise distracts himself until dinner.   Darren Young would like to continue using the Goals Sheet to monitor progress, mainly focusing on getting to the gym for weight training twice a week, the one goal he has not been very successful with.    Biggest challenge:  Keeping chips out of the house.    24-hr recall suggests intake of 1460 kcal:  (Up at 5:30 AM) B (6:30 AM)-  2 eggs sandwich, 7 c coffee, (w/ touch of creamer and Sweet 'n Low)  310 Snk (9 AM)-  8 oz slim fast          180  L (11:30 PM)-  Small salad w/ 1 tbsp dressing, diet Coke     100 Snk (2 PM)-  5 oz yogurt, 1 apple        180 D (7 PM)-  1 chx thigh & leg w/ skin, salad, 2 tbsp drssng, diet swt tea   650 Snk ( PM)-   Typical day? Yes.      Progress Towards Goal(s):  In progress.   Nutritional Diagnosis:  Nunda-3.3 Overweight/obesity As related to inadequate exercise and inattention to diet.  As evidenced by BMI of 29.    Intervention:  Nutrition education.  Handouts given during visit include:  AVS  Goals Sheet  Demonstrated degree of understanding via:  Teach Back   Monitoring/Evaluation:  Dietary intake, exercise, and body weight prn.  Darren Young will call if he needs some support to maintain motivation.

## 2014-02-25 NOTE — Patient Instructions (Signed)
-   Eat at least 3 meals and 1-2 snacks per day. Aim for no more than 5 hours between eating. Eat breakfast within one hour of getting up.  - Obtain twice as many veg's as protein or carbohydrate foods for both lunch and dinner.  - Exercise at least 45 minutes 4 X week. Include weights at least twice a week.   - Consider what days and times will be best for you to go to the gym CONSISTENTLY each week (Ast & Wed?).    - Consider what you can do at home for weight training, i.e., situps, pushups, dumbbells, squats.  Devise a home workout routine, and write it down; use when    you are pressed for time.

## 2014-07-05 ENCOUNTER — Encounter (HOSPITAL_COMMUNITY): Payer: Self-pay

## 2014-07-05 ENCOUNTER — Inpatient Hospital Stay (HOSPITAL_COMMUNITY)
Admission: EM | Admit: 2014-07-05 | Discharge: 2014-07-08 | DRG: 871 | Disposition: A | Discharge status: Home / Self Care | Payer: 59 | Attending: Internal Medicine | Admitting: Internal Medicine

## 2014-07-05 DIAGNOSIS — R739 Hyperglycemia, unspecified: Secondary | ICD-10-CM | POA: Diagnosis present

## 2014-07-05 DIAGNOSIS — N179 Acute kidney failure, unspecified: Secondary | ICD-10-CM | POA: Diagnosis present

## 2014-07-05 DIAGNOSIS — IMO0001 Reserved for inherently not codable concepts without codable children: Secondary | ICD-10-CM | POA: Insufficient documentation

## 2014-07-05 DIAGNOSIS — D649 Anemia, unspecified: Secondary | ICD-10-CM | POA: Diagnosis present

## 2014-07-05 DIAGNOSIS — R509 Fever, unspecified: Secondary | ICD-10-CM | POA: Diagnosis present

## 2014-07-05 DIAGNOSIS — A419 Sepsis, unspecified organism: Principal | ICD-10-CM | POA: Diagnosis present

## 2014-07-05 DIAGNOSIS — E876 Hypokalemia: Secondary | ICD-10-CM | POA: Diagnosis present

## 2014-07-05 DIAGNOSIS — R6521 Severe sepsis with septic shock: Secondary | ICD-10-CM | POA: Diagnosis present

## 2014-07-05 DIAGNOSIS — E872 Acidosis, unspecified: Secondary | ICD-10-CM | POA: Diagnosis present

## 2014-07-05 DIAGNOSIS — D696 Thrombocytopenia, unspecified: Secondary | ICD-10-CM | POA: Diagnosis present

## 2014-07-05 DIAGNOSIS — Z87448 Personal history of other diseases of urinary system: Secondary | ICD-10-CM | POA: Diagnosis present

## 2014-07-05 DIAGNOSIS — N359 Urethral stricture, unspecified: Secondary | ICD-10-CM | POA: Diagnosis present

## 2014-07-05 LAB — URINALYSIS, ROUTINE W REFLEX MICROSCOPIC
BILIRUBIN URINE: NEGATIVE
GLUCOSE, UA: NEGATIVE mg/dL
Ketones, ur: NEGATIVE mg/dL
Nitrite: POSITIVE — AB
PROTEIN: NEGATIVE mg/dL
Specific Gravity, Urine: 1.009 (ref 1.005–1.030)
Urobilinogen, UA: 0.2 mg/dL (ref 0.0–1.0)
pH: 6 (ref 5.0–8.0)

## 2014-07-05 LAB — CBC WITH DIFFERENTIAL/PLATELET
BASOS PCT: 0 % (ref 0–1)
Basophils Absolute: 0 10*3/uL (ref 0.0–0.1)
Eosinophils Absolute: 0 10*3/uL (ref 0.0–0.7)
Eosinophils Relative: 0 % (ref 0–5)
HEMATOCRIT: 39.1 % (ref 39.0–52.0)
Hemoglobin: 12.8 g/dL — ABNORMAL LOW (ref 13.0–17.0)
Lymphocytes Relative: 3 % — ABNORMAL LOW (ref 12–46)
Lymphs Abs: 0.2 10*3/uL — ABNORMAL LOW (ref 0.7–4.0)
MCH: 30 pg (ref 26.0–34.0)
MCHC: 32.7 g/dL (ref 30.0–36.0)
MCV: 91.8 fL (ref 78.0–100.0)
MONO ABS: 0.1 10*3/uL (ref 0.1–1.0)
MONOS PCT: 1 % — AB (ref 3–12)
NEUTROS PCT: 96 % — AB (ref 43–77)
Neutro Abs: 5.8 10*3/uL (ref 1.7–7.7)
Platelets: 158 10*3/uL (ref 150–400)
RBC: 4.26 MIL/uL (ref 4.22–5.81)
RDW: 13.1 % (ref 11.5–15.5)
WBC: 6.1 10*3/uL (ref 4.0–10.5)

## 2014-07-05 LAB — BASIC METABOLIC PANEL
ANION GAP: 14 (ref 5–15)
BUN: 16 mg/dL (ref 6–23)
CALCIUM: 8.4 mg/dL (ref 8.4–10.5)
CO2: 18 mmol/L — ABNORMAL LOW (ref 19–32)
Chloride: 103 mmol/L (ref 96–112)
Creatinine, Ser: 1.33 mg/dL (ref 0.50–1.35)
GFR calc Af Amer: 65 mL/min — ABNORMAL LOW (ref >90)
GFR, EST NON AFRICAN AMERICAN: 56 mL/min — AB (ref >90)
GLUCOSE: 190 mg/dL — AB (ref 70–99)
Potassium: 2.9 mmol/L — ABNORMAL LOW (ref 3.5–5.1)
SODIUM: 135 mmol/L (ref 135–145)

## 2014-07-05 LAB — URINE MICROSCOPIC-ADD ON

## 2014-07-05 LAB — LACTIC ACID, PLASMA: Lactic Acid, Venous: 5.9 mmol/L (ref 0.5–2.0)

## 2014-07-05 MED ORDER — SODIUM CHLORIDE 0.9 % IV BOLUS (SEPSIS)
1000.0000 mL | Freq: Once | INTRAVENOUS | Status: AC
  Administered 2014-07-05: 1000 mL via INTRAVENOUS

## 2014-07-05 MED ORDER — SODIUM CHLORIDE 0.9 % IJ SOLN
3.0000 mL | Freq: Two times a day (BID) | INTRAMUSCULAR | Status: DC
  Administered 2014-07-06 (×2): 3 mL via INTRAVENOUS

## 2014-07-05 MED ORDER — DIPHENHYDRAMINE HCL 25 MG PO CAPS
25.0000 mg | ORAL_CAPSULE | Freq: Every day | ORAL | Status: DC
  Administered 2014-07-07: 25 mg via ORAL
  Filled 2014-07-05 (×5): qty 1

## 2014-07-05 MED ORDER — SODIUM CHLORIDE 0.9 % IV BOLUS (SEPSIS)
1000.0000 mL | INTRAVENOUS | Status: AC
  Administered 2014-07-05: 1000 mL via INTRAVENOUS

## 2014-07-05 MED ORDER — ONDANSETRON HCL 4 MG/2ML IJ SOLN: 4.0000 mg | Freq: Four times a day (QID) | INTRAMUSCULAR | Status: DC | PRN

## 2014-07-05 MED ORDER — PIPERACILLIN-TAZOBACTAM 3.375 G IVPB 30 MIN
3.3750 g | Freq: Once | INTRAVENOUS | Status: AC
  Administered 2014-07-05: 3.375 g via INTRAVENOUS
  Filled 2014-07-05: qty 50

## 2014-07-05 MED ORDER — ACETAMINOPHEN 650 MG RE SUPP: 650.0000 mg | Freq: Four times a day (QID) | RECTAL | Status: DC | PRN

## 2014-07-05 MED ORDER — ACETAMINOPHEN 325 MG PO TABS
650.0000 mg | ORAL_TABLET | Freq: Four times a day (QID) | ORAL | Status: DC | PRN
  Administered 2014-07-06 – 2014-07-07 (×3): 650 mg via ORAL
  Filled 2014-07-05 (×3): qty 2

## 2014-07-05 MED ORDER — ONDANSETRON HCL 4 MG PO TABS: 4.0000 mg | ORAL_TABLET | Freq: Four times a day (QID) | ORAL | Status: DC | PRN

## 2014-07-05 MED ORDER — SODIUM CHLORIDE 0.9 % IV SOLN: Freq: Once | INTRAVENOUS | Status: DC

## 2014-07-05 MED ORDER — PIPERACILLIN-TAZOBACTAM 3.375 G IVPB
3.3750 g | Freq: Three times a day (TID) | INTRAVENOUS | Status: DC
  Administered 2014-07-06 – 2014-07-08 (×7): 3.375 g via INTRAVENOUS
  Filled 2014-07-05 (×9): qty 50

## 2014-07-05 MED ORDER — SODIUM CHLORIDE 0.9 % IV SOLN
INTRAVENOUS | Status: DC
  Administered 2014-07-06 (×2): via INTRAVENOUS

## 2014-07-05 NOTE — ED Notes (Addendum)
This nurse and ed tech at bedside to do in and out cath using sterile technique. Pt tolerated well. No visible hematuria noted. Urine yellow without odor.Output of 329ml.

## 2014-07-05 NOTE — H&P (Addendum)
[facility] History and Physical  Darren Young AYT:[id number] DOB: Dec 15, 1950 DOA: 07/05/2014  Referring physician: ER physician. PCP: Lujean Amel, MD   Chief Complaint: Fever and chills.  HPI: Darren Young is a 64 y.o. male history of urethral strictures with had a procedure to dilate the stricture started developing fever and chills after reaching home. Patient was given Rocephin intramuscular one dose prior to the procedure. Patient states he also had a fever of 101F at home. Since he had persistent symptoms he came to the ER and was found to be hypotensive and tachycardic. In the ER he was afebrile though. Patient states he also had voided urine which was bloody. Patient otherwise denies any shortness of breath chest pain  diarrhea though he had one episode of vomiting. He has been having some upper respiratory tract infection symptoms for last few days. Patient was given fluid boluses and admitted for sepsis from urinary source. Dr. Junious Silk was notified by ER physician about patient's admission.  Review of Systems: As presented in the history of presenting illness, rest negative.  Past Medical History  Diagnosis Date  . Urethral stricture RECURRENT  . Incomplete bladder emptying   . Nocturia   . Weak urinary stream    Past Surgical History  Procedure Laterality Date  . Cystoscopy w/ internal urethrotomy  07-24-2006  &  1996  DR [place]  . Cystoscopy with urethral dilatation N/A 09/26/2012    Procedure: CYSTOSCOPY WITH URETHRAL DILATATION;  Surgeon: Fredricka Bonine, MD;  Location: [facility];  Service: Urology;  Laterality: N/A;  . Cystoscopy with urethral dilatation N/A 04/24/2013    Procedure: CYSTOSCOPY WITH HOLMIUM LASER OF URETHRAL STRICTURE;  Surgeon: Fredricka Bonine, MD;  Location: [facility];  Service: Urology;  Laterality: N/A;  . Holmium laser application N/A 35/09/7320    Procedure: HOLMIUM  LASER APPLICATION;  Surgeon: Fredricka Bonine, MD;  Location: [facility];  Service: Urology;  Laterality: N/A;   Social History:  reports that he has never smoked. He has never used smokeless tobacco. He reports that he does not drink alcohol or use illicit drugs. Where does patient live home. Can patient participate in ADLs? Yes  Allergies  Allergen Reactions  . Ciprofloxacin Rash    Family History:  Family History  Problem Relation Age of Onset  . Diabetes Mellitus II Mother   . Stroke Father       Prior to Admission medications   Medication Sig Start Date End Date Taking? Authorizing Provider  acetaminophen (TYLENOL) 650 MG CR tablet Take 1,300 mg by mouth every 8 (eight) hours as needed for pain (pain).   Yes Historical Provider, MD  diphenhydrAMINE (BENADRYL) 25 MG tablet Take 500 mg by mouth at bedtime.    Yes Historical Provider, MD  ibuprofen (ADVIL,MOTRIN) 200 MG tablet Take 400 mg by mouth every 6 (six) hours as needed for fever or moderate pain (fever, pain and chills).   Yes Historical Provider, MD    Physical Exam: Filed Vitals:   07/05/14 2045 07/05/14 2100 07/05/14 2115 07/05/14 2130  BP: 89/56 88/61 87/53  90/59  Pulse: 105 103 103 104  Temp:      TempSrc:      Resp: 25 26 21 28   SpO2: 93% 93% 93% 94%     General:  Well-developed and nourished.  Eyes: Anicteric no pallor.  ENT: No discharge from the ears eyes nose or mouth.  Neck: No  mass felt. No JVD appreciated.  Cardiovascular: S1-S2 heard.  Respiratory: No rhonchi or crepitations.  Abdomen: Soft nontender bowel sounds present.  Skin: No rash.  Musculoskeletal: No edema.  Psychiatric: Appears normal.  Neurologic: Alert awake oriented to time place and person. Moves all extremities.  Labs on Admission:  Basic Metabolic Panel:  Recent Labs Lab 07/05/14 1957  NA 135  K 2.9*  CL 103  CO2 18*  GLUCOSE 190*  BUN 16  CREATININE 1.33  CALCIUM 8.4   Liver  Function Tests: No results for input(s): AST, ALT, ALKPHOS, BILITOT, PROT, ALBUMIN in the last 168 hours. No results for input(s): LIPASE, AMYLASE in the last 168 hours. No results for input(s): AMMONIA in the last 168 hours. CBC:  Recent Labs Lab 07/05/14 1957  WBC 6.1  NEUTROABS 5.8  HGB 12.8*  HCT 39.1  MCV 91.8  PLT 158   Cardiac Enzymes: No results for input(s): CKTOTAL, CKMB, CKMBINDEX, TROPONINI in the last 168 hours.  BNP (last 3 results) No results for input(s): BNP in the last 8760 hours.  ProBNP (last 3 results) No results for input(s): PROBNP in the last 8760 hours.  CBG: No results for input(s): GLUCAP in the last 168 hours.  Radiological Exams on Admission: No results found.  EKG - sinus tachycardia with nonspecific T-wave changes.  Assessment/Plan Principal Problem:   Sepsis Active Problems:   Normocytic anemia   History of urethral stricture   1. Sepsis from urinary source - UA is pending. Blood cultures have been ordered. Patient's lactic acid is elevated. At this time patient is third liter normal saline bolus and patient's blood pressures are improved from the first liter. We'll closely monitor with IV fluids and empiric  antibiotics. Patient has been placed on Zosyn. 2. Metabolic acidosis probably from lactic acidosis - closely follow lactate levels after hydration. Follow metabolic panel. 3. Hypokalemia - cause not clear. Patient did have one episode of vomiting. Replace and recheck. Check magnesium levels. 4. Hyperglycemia - check hemoglobin A1c.  5. Urethral strictures - status post dilation. When necessary in the catheter if patient cannot void. Patient also does self cath at home. 6. Normocytic anemia - probably secondary to immaturity of closely follow CBC. There is no significant fall in hemoglobin and further workup as outpatient.  Chest x-ray is pending.  Addendum - patient's blood pressure still remains soft despite lactic acid  improving. At this time I have ordered 2 more liters normal saline bolus and will transfer to stepdown unit. I have consulted critical care for further assistance. I will also increase patient's antibiotics and added vancomycin in addition to Zosyn. Chest x-ray does not show anything acute.  DVT Prophylaxis SCDs. Avoiding Lovenox because of possible hematuria. Code Status: Full code.  Family Communication: Patient's wife at the bedside.  Disposition Plan: Admit to inpatient.    KAKRAKANDY,ARSHAD N. [facility] Pager [phone].  If 7PM-7AM, please contact night-coverage [email or web address] Password [facility] 07/05/2014, 10:32 PM

## 2014-07-05 NOTE — ED Notes (Signed)
Forrest, PA at bedside.

## 2014-07-05 NOTE — ED Notes (Signed)
Pt stood on side of bed to attempt to void but was unable to do so at this time.

## 2014-07-05 NOTE — ED Notes (Signed)
Pt has a urethral stricture irrigated earlier today and since then has spiked a fever and is experiencing chills. Pt reports the chills started approx 1/2 hour after the procedure. Pt reports a fever of 101 at home, took two tylenol at around 4pm and advil just before arrival to ER.

## 2014-07-05 NOTE — ED Notes (Addendum)
Patient has been able to urinate twice since having his procedure today. Patient states that he has to [place] himself daily at least once a bedtime. He has been doing so for the past year. He denies retention. Patient sees Dr. Junious Silk from urology.

## 2014-07-05 NOTE — ED Notes (Signed)
Forrester, MD at bedside and made pt and family aware of speaking to urologist and urologist recommendation to perform in and out cath using 14FR cath if pt unable to void, IV ABT, and staying overnight. Pt verbalized understanding.

## 2014-07-05 NOTE — ED Notes (Signed)
Awake. Verbally responsive. Resp even and unlabored. No audible adventitious breath sounds noted. ABC's intact. Lower abd nondistended. Pt denies urge to void. BS (+) and active x4 quadrants. No N/V/D reported since arrival. Family at bedside.

## 2014-07-05 NOTE — ED Notes (Signed)
RN made aware of Pts lactic acid level 5.9.

## 2014-07-05 NOTE — ED Provider Notes (Signed)
CSN: [id number]     Arrival date & time 07/05/14  9924 History   First MD Initiated Contact with Patient 07/05/14 1946     Chief Complaint  Patient presents with  . Chills  . Fever     (Consider location/radiation/quality/duration/timing/severity/associated sxs/prior Treatment) Patient is a 64 y.o. male presenting with fever and hematuria. The history is provided by the patient.  Fever Max temp prior to arrival:  101 Temp source:  Oral Severity:  Moderate Onset quality:  Sudden Duration:  4 hours Timing:  Constant Progression:  Resolved Chronicity:  New Relieved by:  Ibuprofen Worsened by:  Nothing tried Ineffective treatments:  None tried Associated symptoms: chills   Associated symptoms: no chest pain, no cough, no diarrhea, no dysuria, no headaches, no nausea, no rhinorrhea and no vomiting   Hematuria This is a new problem. The current episode started 3 to 5 hours ago. The problem occurs constantly. The problem has not changed since onset.Pertinent negatives include no chest pain, no abdominal pain, no headaches and no shortness of breath. Nothing aggravates the symptoms. Nothing relieves the symptoms. He has tried nothing for the symptoms. The treatment provided no relief.    Past Medical History  Diagnosis Date  . Urethral stricture RECURRENT  . Incomplete bladder emptying   . Nocturia   . Weak urinary stream    Past Surgical History  Procedure Laterality Date  . Cystoscopy w/ internal urethrotomy  07-24-2006  &  1996  DR [place]  . Cystoscopy with urethral dilatation N/A 09/26/2012    Procedure: CYSTOSCOPY WITH URETHRAL DILATATION;  Surgeon: Fredricka Bonine, MD;  Location: [facility];  Service: Urology;  Laterality: N/A;  . Cystoscopy with urethral dilatation N/A 04/24/2013    Procedure: CYSTOSCOPY WITH HOLMIUM LASER OF URETHRAL STRICTURE;  Surgeon: Fredricka Bonine, MD;  Location: [facility];  Service:  Urology;  Laterality: N/A;  . Holmium laser application N/A 26/12/3417    Procedure: HOLMIUM LASER APPLICATION;  Surgeon: Fredricka Bonine, MD;  Location: [facility];  Service: Urology;  Laterality: N/A;   No family history on file. History  Substance Use Topics  . Smoking status: Never Smoker   . Smokeless tobacco: Never Used  . Alcohol Use: No    Review of Systems  Constitutional: Positive for fever and chills.  HENT: Negative for drooling and rhinorrhea.   Eyes: Negative for pain.  Respiratory: Negative for cough and shortness of breath.   Cardiovascular: Negative for chest pain and leg swelling.  Gastrointestinal: Negative for nausea, vomiting, abdominal pain and diarrhea.  Genitourinary: Negative for dysuria and hematuria.  Musculoskeletal: Negative for gait problem and neck pain.  Skin: Negative for color change.  Neurological: Negative for numbness and headaches.  Hematological: Negative for adenopathy.  Psychiatric/Behavioral: Negative for behavioral problems.  All other systems reviewed and are negative.     Allergies  Ciprofloxacin  Home Medications   Prior to Admission medications   Medication Sig Start Date End Date Taking? Authorizing Provider  acetaminophen (TYLENOL) 650 MG CR tablet Take 1,300 mg by mouth every 8 (eight) hours as needed for pain (pain).   Yes Historical Provider, MD  diphenhydrAMINE (BENADRYL) 25 MG tablet Take 500 mg by mouth at bedtime.    Yes Historical Provider, MD  ibuprofen (ADVIL,MOTRIN) 200 MG tablet Take 400 mg by mouth every 6 (six) hours as needed for fever or moderate pain (fever, pain and chills).   Yes  Historical Provider, MD   BP 81/53 mmHg  Pulse 110  Temp(Src) 98.4 F (36.9 C) (Oral)  Resp 24  SpO2 96% Physical Exam  Constitutional: He is oriented to person, place, and time. He appears well-developed and well-nourished.  HENT:  Head: Normocephalic and atraumatic.  Right Ear: External ear normal.   Left Ear: External ear normal.  Nose: Nose normal.  Mouth/Throat: Oropharynx is clear and moist. No oropharyngeal exudate.  Eyes: Conjunctivae and EOM are normal. Pupils are equal, round, and reactive to light.  Neck: Normal range of motion. Neck supple.  Cardiovascular: Regular rhythm, normal heart sounds and intact distal pulses.  Exam reveals no gallop and no friction rub.   No murmur heard. HR 110  Pulmonary/Chest: Effort normal and breath sounds normal. No respiratory distress. He has no wheezes.  Abdominal: Soft. Bowel sounds are normal. He exhibits no distension. There is no tenderness. There is no rebound and no guarding.  Musculoskeletal: Normal range of motion. He exhibits no edema or tenderness.  Neurological: He is alert and oriented to person, place, and time.  Skin: Skin is warm and dry.  Psychiatric: He has a normal mood and affect. His behavior is normal.  Nursing note and vitals reviewed.   ED Course  Procedures (including critical care time) Labs Review Labs Reviewed  URINALYSIS, ROUTINE W REFLEX MICROSCOPIC - Abnormal; Notable for the following:    Hgb urine dipstick LARGE (*)    Nitrite POSITIVE (*)    Leukocytes, UA LARGE (*)    All other components within normal limits  CBC WITH DIFFERENTIAL/PLATELET - Abnormal; Notable for the following:    Hemoglobin 12.8 (*)    Neutrophils Relative % 96 (*)    Lymphocytes Relative 3 (*)    Lymphs Abs 0.2 (*)    Monocytes Relative 1 (*)    All other components within normal limits  BASIC METABOLIC PANEL - Abnormal; Notable for the following:    Potassium 2.9 (*)    CO2 18 (*)    Glucose, Bld 190 (*)    GFR calc non Af Amer 56 (*)    GFR calc Af Amer 65 (*)    All other components within normal limits  LACTIC ACID, PLASMA - Abnormal; Notable for the following:    Lactic Acid, Venous 5.9 (*)    All other components within normal limits  URINE MICROSCOPIC-ADD ON - Abnormal; Notable for the following:    Bacteria, UA  FEW (*)    All other components within normal limits  CULTURE, BLOOD (ROUTINE X 2)  CULTURE, BLOOD (ROUTINE X 2)  LACTIC ACID, PLASMA  LACTIC ACID, PLASMA  LACTIC ACID, PLASMA  COMPREHENSIVE METABOLIC PANEL  CBC WITH DIFFERENTIAL/PLATELET  CBC    Imaging Review No results found.   EKG Interpretation   Date/Time:  Monday July 05 2014 20:31:59 EST Ventricular Rate:  108 PR Interval:  183 QRS Duration: 101 QT Interval:  325 QTC Calculation: 436 R Axis:   77 Text Interpretation:  Sinus tachycardia Borderline T abnormalities,  inferior leads Confirmed by Gracieann Stannard  MD, Bjorn Hallas ([id number]) on 07/05/2014  9:13:51 PM      MDM   Final diagnoses:  Sepsis, due to unspecified organism    8:20 PM 64 y.o. male  With a history of urethral strictures status post instrumentation today by urology who presents with fever and chills. He states that he has procedure this morning and around 4 PM he developed sudden onset chills and fever up to 101.  He took some ibuprofen  And presents now for evaluation. He is afebrile and mildly tachycardic here. Blood pressure soft with a systolic blood pressure in the 90s on my exam. He states that he has had 2 small urinations today with some  Urine mixed with blood. He states that he has not been able to urinate since that time. He denies any pain on exam and states that he otherwise feels well currently. We'll get screening labs and urinalysis. We'll touchbase with urology.   Discussed the case with Dr. Junious Silk ([place]).  The patient did get IM Rocephin prior to the procedure.   the patient's blood pressure remains soft after 1 L IV fluids and he is mildly tachycardic. We'll treat him with Zosyn and admit. Dr. Junious Silk made aware of the admission.  Pamella Pert, MD 07/05/14 2340

## 2014-07-06 ENCOUNTER — Inpatient Hospital Stay (HOSPITAL_COMMUNITY): Payer: 59

## 2014-07-06 ENCOUNTER — Encounter (HOSPITAL_COMMUNITY): Payer: Self-pay | Admitting: *Deleted

## 2014-07-06 DIAGNOSIS — E872 Acidosis, unspecified: Secondary | ICD-10-CM | POA: Diagnosis present

## 2014-07-06 DIAGNOSIS — E876 Hypokalemia: Secondary | ICD-10-CM | POA: Diagnosis present

## 2014-07-06 DIAGNOSIS — R739 Hyperglycemia, unspecified: Secondary | ICD-10-CM | POA: Diagnosis present

## 2014-07-06 DIAGNOSIS — Z87448 Personal history of other diseases of urinary system: Secondary | ICD-10-CM

## 2014-07-06 DIAGNOSIS — R6521 Severe sepsis with septic shock: Secondary | ICD-10-CM

## 2014-07-06 DIAGNOSIS — A419 Sepsis, unspecified organism: Secondary | ICD-10-CM | POA: Diagnosis present

## 2014-07-06 LAB — COMPREHENSIVE METABOLIC PANEL
ALT: 12 U/L (ref 0–53)
ANION GAP: 6 (ref 5–15)
AST: 20 U/L (ref 0–37)
Albumin: 3.2 g/dL — ABNORMAL LOW (ref 3.5–5.2)
Alkaline Phosphatase: 43 U/L (ref 39–117)
BILIRUBIN TOTAL: 0.6 mg/dL (ref 0.3–1.2)
BUN: 13 mg/dL (ref 6–23)
CALCIUM: 8 mg/dL — AB (ref 8.4–10.5)
CO2: 23 mmol/L (ref 19–32)
Chloride: 111 mmol/L (ref 96–112)
Creatinine, Ser: 1.11 mg/dL (ref 0.50–1.35)
GFR calc Af Amer: 80 mL/min — ABNORMAL LOW (ref >90)
GFR calc non Af Amer: 69 mL/min — ABNORMAL LOW (ref >90)
Glucose, Bld: 99 mg/dL (ref 70–99)
POTASSIUM: 4.2 mmol/L (ref 3.5–5.1)
Sodium: 140 mmol/L (ref 135–145)
Total Protein: 5.5 g/dL — ABNORMAL LOW (ref 6.0–8.3)

## 2014-07-06 LAB — CBC WITH DIFFERENTIAL/PLATELET
Basophils Absolute: 0 10*3/uL (ref 0.0–0.1)
Basophils Relative: 0 % (ref 0–1)
Eosinophils Absolute: 0 10*3/uL (ref 0.0–0.7)
Eosinophils Relative: 0 % (ref 0–5)
HCT: 34.5 % — ABNORMAL LOW (ref 39.0–52.0)
Hemoglobin: 11.3 g/dL — ABNORMAL LOW (ref 13.0–17.0)
LYMPHS PCT: 4 % — AB (ref 12–46)
Lymphs Abs: 0.7 10*3/uL (ref 0.7–4.0)
MCH: 29.7 pg (ref 26.0–34.0)
MCHC: 32.8 g/dL (ref 30.0–36.0)
MCV: 90.8 fL (ref 78.0–100.0)
Monocytes Absolute: 1.6 10*3/uL — ABNORMAL HIGH (ref 0.1–1.0)
Monocytes Relative: 9 % (ref 3–12)
NEUTROS PCT: 87 % — AB (ref 43–77)
Neutro Abs: 15.3 10*3/uL — ABNORMAL HIGH (ref 1.7–7.7)
Platelets: 146 10*3/uL — ABNORMAL LOW (ref 150–400)
RBC: 3.8 MIL/uL — ABNORMAL LOW (ref 4.22–5.81)
RDW: 13.3 % (ref 11.5–15.5)
WBC Morphology: INCREASED
WBC: 17.6 10*3/uL — ABNORMAL HIGH (ref 4.0–10.5)

## 2014-07-06 LAB — PHOSPHORUS: PHOSPHORUS: 2.6 mg/dL (ref 2.3–4.6)

## 2014-07-06 LAB — LACTIC ACID, PLASMA
LACTIC ACID, VENOUS: 2.5 mmol/L — AB (ref 0.5–2.0)
LACTIC ACID, VENOUS: 2.4 mmol/L — AB (ref 0.5–2.0)
LACTIC ACID, VENOUS: 1.8 mmol/L (ref 0.5–2.0)
Lactic Acid, Venous: 5.2 mmol/L (ref 0.5–2.0)
Lactic Acid, Venous: 2.2 mmol/L (ref 0.5–2.0)

## 2014-07-06 LAB — INFLUENZA PANEL BY PCR (TYPE A & B)
H1N1FLUPCR: NOT DETECTED
INFLBPCR: NEGATIVE
Influenza A By PCR: NEGATIVE

## 2014-07-06 LAB — PROCALCITONIN: Procalcitonin: 37.36 ng/mL

## 2014-07-06 LAB — MRSA PCR SCREENING: MRSA by PCR: NEGATIVE

## 2014-07-06 LAB — TROPONIN I: Troponin I: <0.03 ng/mL (ref <0.031)

## 2014-07-06 LAB — MAGNESIUM: Magnesium: 1.4 mg/dL — ABNORMAL LOW (ref 1.5–2.5)

## 2014-07-06 MED ORDER — SODIUM CHLORIDE 0.9 % IV BOLUS (SEPSIS)
2000.0000 mL | Freq: Once | INTRAVENOUS | Status: AC
  Administered 2014-07-06: 2000 mL via INTRAVENOUS

## 2014-07-06 MED ORDER — SODIUM CHLORIDE 0.9 % IV BOLUS (SEPSIS): 500.0000 mL | Freq: Once | INTRAVENOUS | Status: AC

## 2014-07-06 MED ORDER — VANCOMYCIN HCL IN DEXTROSE 1-5 GM/200ML-% IV SOLN
1000.0000 mg | Freq: Three times a day (TID) | INTRAVENOUS | Status: DC
  Administered 2014-07-06 – 2014-07-08 (×7): 1000 mg via INTRAVENOUS
  Filled 2014-07-06 (×8): qty 200

## 2014-07-06 MED ORDER — POTASSIUM CHLORIDE 20 MEQ/15ML (10%) PO SOLN
40.0000 meq | Freq: Once | ORAL | Status: AC
  Administered 2014-07-06: 40 meq via ORAL
  Filled 2014-07-06: qty 30

## 2014-07-06 MED ORDER — SODIUM CHLORIDE 0.9 % IV BOLUS (SEPSIS)
1000.0000 mL | Freq: Once | INTRAVENOUS | Status: AC
  Administered 2014-07-06: 1000 mL via INTRAVENOUS

## 2014-07-06 MED ORDER — SODIUM CHLORIDE 0.9 % IV BOLUS (SEPSIS)
500.0000 mL | Freq: Once | INTRAVENOUS | Status: AC
  Administered 2014-07-06: 05:00:00 via INTRAVENOUS

## 2014-07-06 MED ORDER — POTASSIUM CHLORIDE 10 MEQ/100ML IV SOLN
10.0000 meq | INTRAVENOUS | Status: AC
  Administered 2014-07-06 (×2): 10 meq via INTRAVENOUS
  Filled 2014-07-06 (×2): qty 100

## 2014-07-06 NOTE — Progress Notes (Addendum)
ANTIBIOTIC CONSULT NOTE - INITIAL  Pharmacy Consult for Vancomycin Indication: Sepsis  Allergies  Allergen Reactions  . Ciprofloxacin Rash    Patient Measurements: Height: 5\' 11"  (180.3 cm) Weight: 206 lb (93.441 kg) IBW/kg (Calculated) : 75.3   Vital Signs: Temp: 98.1 F (36.7 C) (02/16 0716) Temp Source: Oral (02/16 0716) BP: 94/61 mmHg (02/16 0716) Pulse Rate: 95 (02/16 0716) Intake/Output from previous day: 02/15 0701 - 02/16 0700 In: 2175 [P.O.:1200; I.V.:775; IV Piggyback:200] Out: 2600 [Urine:2600] Intake/Output from this shift:    Labs:  Recent Labs  07/05/14 1957 07/06/14 0510  WBC 6.1 17.6*  HGB 12.8* 11.3*  PLT 158 146*  CREATININE 1.33 1.11   Estimated Creatinine Clearance: 80.5 mL/min (by C-G formula based on Cr of 1.11). No results for input(s): VANCOTROUGH, VANCOPEAK, VANCORANDOM, GENTTROUGH, GENTPEAK, GENTRANDOM, TOBRATROUGH, TOBRAPEAK, TOBRARND, AMIKACINPEAK, AMIKACINTROU, AMIKACIN in the last 72 hours.   Microbiology: No results found for this or any previous visit (from the past 720 hour(s)).  Medical History: Past Medical History  Diagnosis Date  . Urethral stricture RECURRENT  . Incomplete bladder emptying   . Nocturia   . Weak urinary stream     Medications:  Scheduled:  . sodium chloride   Intravenous Once  . diphenhydrAMINE  25 mg Oral QHS  . piperacillin-tazobactam (ZOSYN)  IV  3.375 g Intravenous Q8H  . sodium chloride  2,000 mL Intravenous Once  . sodium chloride  3 mL Intravenous Q12H  . vancomycin  1,000 mg Intravenous Q8H   Infusions:  . sodium chloride 125 mL/hr at 07/06/14 0548   Assessment: 28 yoM presents to ED with c/o fever and chills s/p instrumentation today by urology for urethral strictures.  Patient found to be hypotensive and tachycardic in ED.  Pharmacy initially consulted to assist with dosing of Zosyn for sepsis secondary to urinary source, now adding Vancomycin to antibiotic regimen.  2/15 >> Zosyn  >> 2/16 >> Vancomycin >>    Tmax: 99.82F WBCs: elevated, 17.6K Renal: SCr 1.11, CrCl ~ 81 mL/min CG Lactic Acid: 5.9 > 2.2  2/16 blood x 2: sent 2/16 urine: sent   Goal of Therapy:  Vancomycin trough level 15-20 mcg/mL Appropriate antibiotic dosing for renal function and indication Eradication of infection  Plan:   Begin Vancomycin 1g IV q8h.  Plan for Vancomycin trough level at steady state.  Continue Zosyn 3.375g IV q8h (infuse over 4 hours) as previously ordered.  Continue to monitor renal function, cultures, clinical course.   Lindell Spar, PharmD, BCPS Pager: [phone] 07/06/2014 7:38 AM

## 2014-07-06 NOTE — Progress Notes (Signed)
CRITICAL VALUE ALERT  Critical value received: lactic acid   Date of notification:  07/06/14    Time of notification:  246   Critical value read back:yes   Nurse who received alert:  Theodosia Quay RN   MD notified (1st page):  K.Schoor NP   Time of first page:  0330 am   MD notified (2nd page):  Time of second page:  Responding MD:  K.Schor NP   Time MD responded:  [phone]

## 2014-07-06 NOTE — Consult Note (Signed)
Consult: Urosepsis, urethral stricture  History of Present Illness: Pt underwent cystoscopy, dilation of urethral stricture in office yesterday Jul 05, 2013. A urine culture was sent (pending) and he was pre-treated with IM Rocephin. He developed chills and malaise post-procedure and has a septic picture. Appreciate hospitalist and PCCM management. Patient currently on Vanc and Zosyn. Last culture in my office, May 2015, grew klebsiella resistant to nitrofurantoin and indermediate to unasyn. Otherwise sensitive to rocephin and zosyn.  Today, he feels better and is voiding without difficulty.    Past Medical History  Diagnosis Date  . Urethral stricture RECURRENT  . Incomplete bladder emptying   . Nocturia   . Weak urinary stream    Past Surgical History  Procedure Laterality Date  . Cystoscopy w/ internal urethrotomy  07-24-2006  &  1996  DR Deloit  . Cystoscopy with urethral dilatation N/A 09/26/2012    Procedure: CYSTOSCOPY WITH URETHRAL DILATATION;  Surgeon: Fredricka Bonine, MD;  Location: [facility];  Service: Urology;  Laterality: N/A;  . Cystoscopy with urethral dilatation N/A 04/24/2013    Procedure: CYSTOSCOPY WITH HOLMIUM LASER OF URETHRAL STRICTURE;  Surgeon: Fredricka Bonine, MD;  Location: [facility];  Service: Urology;  Laterality: N/A;  . Holmium laser application N/A 59/09/[id number]    Procedure: HOLMIUM LASER APPLICATION;  Surgeon: Fredricka Bonine, MD;  Location: [facility];  Service: Urology;  Laterality: N/A;    Home Medications:  Prescriptions prior to admission  Medication Sig Dispense Refill Last Dose  . acetaminophen (TYLENOL) 650 MG CR tablet Take 1,300 mg by mouth every 8 (eight) hours as needed for pain (pain).   07/05/2014 at Unknown time  . diphenhydrAMINE (BENADRYL) 25 MG tablet Take 25 mg by mouth at bedtime.    07/04/2014 at Unknown time  . ibuprofen (ADVIL,MOTRIN) 200 MG  tablet Take 400 mg by mouth every 6 (six) hours as needed for fever or moderate pain (fever, pain and chills).   07/05/2014 at Unknown time   Allergies:  Allergies  Allergen Reactions  . Ciprofloxacin Rash    Family History  Problem Relation Age of Onset  . Diabetes Mellitus II Mother   . Stroke Father    Social History:  reports that he has never smoked. He has never used smokeless tobacco. He reports that he does not drink alcohol or use illicit drugs.  ROS: A complete review of systems was performed.  All systems are negative except for pertinent findings as noted. Review of Systems  All other systems reviewed and are negative.    Physical Exam:  Vital signs in last 24 hours: Temp:  [98.1 F (36.7 C)-99.1 F (37.3 C)] 98.5 F (36.9 C) (02/16 1200) Pulse Rate:  [86-119] 92 (02/16 1200) Resp:  [15-28] 22 (02/16 1200) BP: (81-109)/(53-68) 102/65 mmHg (02/16 1200) SpO2:  [91 %-100 %] 97 % (02/16 1200) Weight:  [93.441 kg (206 lb)] 93.441 kg (206 lb) (02/15 2313) General:  Alert and oriented, No acute distress HEENT: Normocephalic, atraumatic Neck: No JVD or lymphadenopathy Cardiovascular: Regular rate and rhythm Lungs: Regular rate and effort Abdomen: Soft, nontender, nondistended, no abdominal masses Back: No CVA tenderness Extremities: No edema Neurologic: Grossly intact GU: Penis normal without lesion or edema. Scrotum normal without lesion, ecchymosis or edema. Testicles palpably normal. Perineum normal without edema, erythema or ecchymosis.  Laboratory Data:  Results for orders placed or performed during the hospital encounter of 07/05/14 (from the past 24  hour(s))  CBC with Differential/Platelet     Status: Abnormal   Collection Time: 07/05/14  7:57 PM  Result Value Ref Range   WBC 6.1 4.0 - 10.5 K/uL   RBC 4.26 4.22 - 5.81 MIL/uL   Hemoglobin 12.8 (L) 13.0 - 17.0 g/dL   HCT 39.1 39.0 - 52.0 %   MCV 91.8 78.0 - 100.0 fL   MCH 30.0 26.0 - 34.0 pg   MCHC 32.7  30.0 - 36.0 g/dL   RDW 13.1 11.5 - 15.5 %   Platelets 158 150 - 400 K/uL   Neutrophils Relative % 96 (H) 43 - 77 %   Neutro Abs 5.8 1.7 - 7.7 K/uL   Lymphocytes Relative 3 (L) 12 - 46 %   Lymphs Abs 0.2 (L) 0.7 - 4.0 K/uL   Monocytes Relative 1 (L) 3 - 12 %   Monocytes Absolute 0.1 0.1 - 1.0 K/uL   Eosinophils Relative 0 0 - 5 %   Eosinophils Absolute 0.0 0.0 - 0.7 K/uL   Basophils Relative 0 0 - 1 %   Basophils Absolute 0.0 0.0 - 0.1 K/uL  Basic metabolic panel     Status: Abnormal   Collection Time: 07/05/14  7:57 PM  Result Value Ref Range   Sodium 135 135 - 145 mmol/L   Potassium 2.9 (L) 3.5 - 5.1 mmol/L   Chloride 103 96 - 112 mmol/L   CO2 18 (L) 19 - 32 mmol/L   Glucose, Bld 190 (H) 70 - 99 mg/dL   BUN 16 6 - 23 mg/dL   Creatinine, Ser 1.33 0.50 - 1.35 mg/dL   Calcium 8.4 8.4 - 10.5 mg/dL   GFR calc non Af Amer 56 (L) >90 mL/min   GFR calc Af Amer 65 (L) >90 mL/min   Anion gap 14 5 - 15  Lactic acid, plasma     Status: Abnormal   Collection Time: 07/05/14  8:31 PM  Result Value Ref Range   Lactic Acid, Venous 5.9 (HH) 0.5 - 2.0 mmol/L  Urinalysis, Routine w reflex microscopic (if pt has temp above 100.17F)     Status: Abnormal   Collection Time: 07/05/14  9:51 PM  Result Value Ref Range   Color, Urine YELLOW YELLOW   APPearance CLEAR CLEAR   Specific Gravity, Urine 1.009 1.005 - 1.030   pH 6.0 5.0 - 8.0   Glucose, UA NEGATIVE NEGATIVE mg/dL   Hgb urine dipstick LARGE (A) NEGATIVE   Bilirubin Urine NEGATIVE NEGATIVE   Ketones, ur NEGATIVE NEGATIVE mg/dL   Protein, ur NEGATIVE NEGATIVE mg/dL   Urobilinogen, UA 0.2 0.0 - 1.0 mg/dL   Nitrite POSITIVE (A) NEGATIVE   Leukocytes, UA LARGE (A) NEGATIVE  Urine microscopic-add on     Status: Abnormal   Collection Time: 07/05/14  9:51 PM  Result Value Ref Range   Squamous Epithelial / LPF RARE RARE   WBC, UA 21-50 <3 WBC/hpf   RBC / HPF 11-20 <3 RBC/hpf   Bacteria, UA FEW (A) RARE  Lactic acid, plasma     Status:  Abnormal   Collection Time: 07/05/14 11:13 PM  Result Value Ref Range   Lactic Acid, Venous 2.5 (HH) 0.5 - 2.0 mmol/L  Lactic acid, plasma     Status: Abnormal   Collection Time: 07/06/14  2:05 AM  Result Value Ref Range   Lactic Acid, Venous 5.2 (HH) 0.5 - 2.0 mmol/L  Influenza panel by PCR (type A & B, H1N1)     Status: None  Collection Time: 07/06/14  2:40 AM  Result Value Ref Range   Influenza A By PCR NEGATIVE NEGATIVE   Influenza B By PCR NEGATIVE NEGATIVE   H1N1 flu by pcr NOT DETECTED NOT DETECTED  Lactic acid, plasma     Status: Abnormal   Collection Time: 07/06/14  5:10 AM  Result Value Ref Range   Lactic Acid, Venous 2.2 (HH) 0.5 - 2.0 mmol/L  Comprehensive metabolic panel     Status: Abnormal   Collection Time: 07/06/14  5:10 AM  Result Value Ref Range   Sodium 140 135 - 145 mmol/L   Potassium 4.2 3.5 - 5.1 mmol/L   Chloride 111 96 - 112 mmol/L   CO2 23 19 - 32 mmol/L   Glucose, Bld 99 70 - 99 mg/dL   BUN 13 6 - 23 mg/dL   Creatinine, Ser 1.11 0.50 - 1.35 mg/dL   Calcium 8.0 (L) 8.4 - 10.5 mg/dL   Total Protein 5.5 (L) 6.0 - 8.3 g/dL   Albumin 3.2 (L) 3.5 - 5.2 g/dL   AST 20 0 - 37 U/L   ALT 12 0 - 53 U/L   Alkaline Phosphatase 43 39 - 117 U/L   Total Bilirubin 0.6 0.3 - 1.2 mg/dL   GFR calc non Af Amer 69 (L) >90 mL/min   GFR calc Af Amer 80 (L) >90 mL/min   Anion gap 6 5 - 15  CBC with Differential/Platelet     Status: Abnormal   Collection Time: 07/06/14  5:10 AM  Result Value Ref Range   WBC 17.6 (H) 4.0 - 10.5 K/uL   RBC 3.80 (L) 4.22 - 5.81 MIL/uL   Hemoglobin 11.3 (L) 13.0 - 17.0 g/dL   HCT 34.5 (L) 39.0 - 52.0 %   MCV 90.8 78.0 - 100.0 fL   MCH 29.7 26.0 - 34.0 pg   MCHC 32.8 30.0 - 36.0 g/dL   RDW 13.3 11.5 - 15.5 %   Platelets 146 (L) 150 - 400 K/uL   Neutrophils Relative % 87 (H) 43 - 77 %   Lymphocytes Relative 4 (L) 12 - 46 %   Monocytes Relative 9 3 - 12 %   Eosinophils Relative 0 0 - 5 %   Basophils Relative 0 0 - 1 %   Neutro Abs  15.3 (H) 1.7 - 7.7 K/uL   Lymphs Abs 0.7 0.7 - 4.0 K/uL   Monocytes Absolute 1.6 (H) 0.1 - 1.0 K/uL   Eosinophils Absolute 0.0 0.0 - 0.7 K/uL   Basophils Absolute 0.0 0.0 - 0.1 K/uL   WBC Morphology INCREASED BANDS (>20% BANDS)   Troponin I     Status: None   Collection Time: 07/06/14  5:10 AM  Result Value Ref Range   Troponin I <0.03 <0.031 ng/mL  Procalcitonin - Baseline     Status: None   Collection Time: 07/06/14  5:10 AM  Result Value Ref Range   Procalcitonin 37.36 ng/mL  Magnesium     Status: Abnormal   Collection Time: 07/06/14  5:10 AM  Result Value Ref Range   Magnesium 1.4 (L) 1.5 - 2.5 mg/dL  Phosphorus     Status: None   Collection Time: 07/06/14  5:10 AM  Result Value Ref Range   Phosphorus 2.6 2.3 - 4.6 mg/dL  MRSA PCR Screening     Status: None   Collection Time: 07/06/14  8:28 AM  Result Value Ref Range   MRSA by PCR NEGATIVE NEGATIVE  Lactic acid, plasma  Status: Abnormal   Collection Time: 07/06/14  9:56 AM  Result Value Ref Range   Lactic Acid, Venous 2.4 (HH) 0.5 - 2.0 mmol/L   Recent Results (from the past 240 hour(s))  MRSA PCR Screening     Status: None   Collection Time: 07/06/14  8:28 AM  Result Value Ref Range Status   MRSA by PCR NEGATIVE NEGATIVE Final    Comment:        The GeneXpert MRSA Assay (FDA approved for NASAL specimens only), is one component of a comprehensive MRSA colonization surveillance program. It is not intended to diagnose MRSA infection nor to guide or monitor treatment for MRSA infections.    Creatinine:  Recent Labs  07/05/14 1957 07/06/14 0510  CREATININE 1.33 1.11    Impression/Assessment/plan - Urosepsis following urethral stricture dilation-patient appears to be voiding normally. His abdomen was soft and bladder not distended. Cr improved. GU exam was normal. Urine culture from office pending and will hopefully be back tomorrow. Will follow.  Darren Young 07/06/2014, 12:47 PM

## 2014-07-06 NOTE — Progress Notes (Signed)
ANTIBIOTIC CONSULT NOTE - INITIAL  Pharmacy Consult for Zosyn Indication: Sepsis  Allergies  Allergen Reactions  . Ciprofloxacin Rash    Patient Measurements: Height: 5\' 11"  (180.3 cm) Weight: 206 lb (93.441 kg) IBW/kg (Calculated) : 75.3   Vital Signs: Temp: 98.8 F (37.1 C) (02/15 2245) Temp Source: Oral (02/15 2245) BP: 88/54 mmHg (02/15 2245) Pulse Rate: 101 (02/15 2245) Intake/Output from previous day: 02/15 0701 - 02/16 0700 In: 480 [P.O.:480] Out: 1000 [Urine:1000] Intake/Output from this shift: Total I/O In: 480 [P.O.:480] Out: 1000 [Urine:1000]  Labs:  Recent Labs  07/05/14 1957  WBC 6.1  HGB 12.8*  PLT 158  CREATININE 1.33   Estimated Creatinine Clearance: 67.2 mL/min (by C-G formula based on Cr of 1.33). No results for input(s): VANCOTROUGH, VANCOPEAK, VANCORANDOM, GENTTROUGH, GENTPEAK, GENTRANDOM, TOBRATROUGH, TOBRAPEAK, TOBRARND, AMIKACINPEAK, AMIKACINTROU, AMIKACIN in the last 72 hours.   Microbiology: No results found for this or any previous visit (from the past 720 hour(s)).  Medical History: Past Medical History  Diagnosis Date  . Urethral stricture RECURRENT  . Incomplete bladder emptying   . Nocturia   . Weak urinary stream     Medications:  Scheduled:  . sodium chloride   Intravenous Once  . diphenhydrAMINE  25 mg Oral QHS  . piperacillin-tazobactam (ZOSYN)  IV  3.375 g Intravenous Q8H  . sodium chloride  3 mL Intravenous Q12H   Infusions:  . sodium chloride     Assessment: 85 yoM c/o chills, fever s/p instrumentation today by urology for urethral strictures.  Zosyn per Rx for Sepsis.   Goal of Therapy:  Treat infection  Plan:   Zosyn 3.375 Gm IV q8h EI  F/u SCr/cultures as needed  Lawana Pai R 07/06/2014,12:05 AM

## 2014-07-06 NOTE — Progress Notes (Addendum)
CRITICAL VALUE ALERT  Critical value received:  Lactic acid 2.4  Date of notification:  07/06/2014  Time of notification:  11:04  Critical value read back:Yes.    Nurse who received alert:  Lucina Mellow, RN  MD notified (1st page):  Rama, C  Time of first page:  11:05  MD notified (2nd page):  Time of second page:  Responding MD:  Rama, C  Time MD responded:  11:12

## 2014-07-06 NOTE — Progress Notes (Addendum)
Progress Note   Darren Young PZW:[id number] DOB: [birth year] DOA: 07/05/2014 PCP: Lujean Amel, MD   Brief Narrative:   Darren Young is an 64 y.o. male with a PMH of urethral strictures, status post dilation/instrumentation who developed fever and chills after returning home from the procedure 07/04/14. Patient was given Rocephin intramuscular one dose prior to the procedure. Upon arrival to the ER, he was hypotensive and tachycardic but afebrile. His blood pressure responded to aggressive IV fluid boluses, but overnight, developed recurrent hypotension necessitating transfer to the SDU.  Assessment/Plan:   Principal Problem:   Severe sepsis from a urinary source  Continue aggressive fluid volume resuscitation. Was given 30 mL/kg bolus with additional boluses to maintain systolic blood pressure greater than 90.  Antibiotics broadened to include vancomycin and Zosyn.  Monitor serum lactate for resolution.  Urologist made aware of the patient's admission.  PCCM consulted for severe sepsis.  Active Problems: Metabolic acidosis  Secondary to lactic acidosis - closely follow lactate levels after hydration.   Follow metabolic panel.  Hypokalemia  Patient did have one episode of vomiting. Replace and recheck. Check magnesium levels.  Hyperglycemia  Follow-up hemoglobin A1c.   Urethral strictures   Status post dilation. I&O cath if patient cannot void. Patient also does self cath at home.  Normocytic anemia   Monitor hemoglobin.    DVT Prophylaxis  SCDs ordered.  Code Status: Full. Family Communication: No family at the bedside. Disposition Plan: Home when sepsis resolved, patient hemodynamically stable, and cultures back.   IV Access:    Peripheral IV   Procedures and diagnostic studies:   Dg Chest Port 1 View: No active disease.     Medical Consultants:    Dr. Festus Aloe, Urology  Dr. Baltazar Apo, PCCM  Anti-Infectives:     Vancomycin 07/05/14--->  Zosyn 07/05/14--->  Subjective:   Darren Young feels well with no altered mental status. Voiding without difficulty. Had fever and chills in the night, but feels better now. Had an episode of nausea and vomiting overnight.  Objective:    Filed Vitals:   07/06/14 0533 07/06/14 0546 07/06/14 0716 07/06/14 0800  BP: 90/54  94/61   Pulse:  106 95   Temp:  99.1 F (37.3 C) 98.1 F (36.7 C) 98.5 F (36.9 C)  TempSrc:  Oral Oral Oral  Resp:  23 28   Height:      Weight:      SpO2:   98%     Intake/Output Summary (Last 24 hours) at 07/06/14 4235 Last data filed at 07/06/14 0410  Gross per 24 hour  Intake   2175 ml  Output   2600 ml  Net   -425 ml    Exam: Gen:  NAD Cardiovascular:  Tachycardic/regular, No M/R/G Respiratory:  Lungs CTAB Gastrointestinal:  Abdomen soft, NT/ND, + BS Extremities:  No C/E/C   Data Reviewed:    Labs: Basic Metabolic Panel:  Recent Labs Lab 07/05/14 1957 07/06/14 0510  NA 135 140  K 2.9* 4.2  CL 103 111  CO2 18* 23  GLUCOSE 190* 99  BUN 16 13  CREATININE 1.33 1.11  CALCIUM 8.4 8.0*  MG  --  1.4*  PHOS  --  2.6   GFR Estimated Creatinine Clearance: 80.5 mL/min (by C-G formula based on Cr of 1.11). Liver Function Tests:  Recent Labs Lab 07/06/14 0510  AST 20  ALT 12  ALKPHOS 43  BILITOT 0.6  PROT 5.5*  ALBUMIN 3.2*  CBC:  Recent Labs Lab 07/05/14 1957 07/06/14 0510  WBC 6.1 17.6*  NEUTROABS 5.8 15.3*  HGB 12.8* 11.3*  HCT 39.1 34.5*  MCV 91.8 90.8  PLT 158 146*   Cardiac Enzymes:  Recent Labs Lab 07/06/14 0510  TROPONINI <0.03   Sepsis Labs:  Recent Labs Lab 07/05/14 1957 07/05/14 2031 07/05/14 2313 07/06/14 0205 07/06/14 0510  PROCALCITON  --   --   --   --  37.36  WBC 6.1  --   --   --  17.6*  LATICACIDVEN  --  5.9* 2.5* 5.2* 2.2*   Microbiology No results found for this or any previous visit (from the past 240 hour(s)).   Medications:   . sodium  chloride   Intravenous Once  . diphenhydrAMINE  25 mg Oral QHS  . piperacillin-tazobactam (ZOSYN)  IV  3.375 g Intravenous Q8H  . sodium chloride  3 mL Intravenous Q12H  . vancomycin  1,000 mg Intravenous Q8H   Continuous Infusions: . sodium chloride 125 mL/hr at 07/06/14 0548    Time spent: 30 minutes.   LOS: 1 day   Abisola Carrero  [facility] Pager [phone]. If unable to reach me by pager, please call my cell phone at [phone].  *Please refer to [email or web address], password [email or web address] to get updated schedule on who will round on this patient, as hospitalists switch teams weekly. If 7PM-7AM, please contact night-coverage at [email or web address], password [email or web address] for any overnight needs.  07/06/2014, 8:28 AM

## 2014-07-06 NOTE — Progress Notes (Signed)
Notified K. Schorr NP of manual  blood pressure, 90/54. No new orders, at this time. Patient denies needs or wants.

## 2014-07-06 NOTE — Progress Notes (Signed)
CARE MANAGEMENT NOTE 07/06/2014  Patient:  Darren Young, Darren Young   Account Number:  [id number]  Date Initiated:  07/06/2014  Documentation initiated by:  DAVIS,RHONDA  Subjective/Objective Assessment:   pt with sepsis     Action/Plan:   home when stable   Anticipated DC Date:  07/09/2014   Anticipated DC Plan:  HOME/SELF CARE  In-house referral  NA      DC Planning Services  CM consult      Choice offered to / List presented to:             Status of service:  In process, will continue to follow Medicare Important Message given?   (If response is "NO", the following Medicare IM given date fields will be blank) Date Medicare IM given:   Medicare IM given by:   Date Additional Medicare IM given:   Additional Medicare IM given by:    Discharge Disposition:    Per UR Regulation:  Reviewed for med. necessity/level of care/duration of stay  If discussed at Success of Stay Meetings, dates discussed:    Comments:  Feb. 16 2016/Rhonda L. Rosana Hoes, RN, BSN, CCM/Case Management [place] [phone] No discharge needs present of time of review.

## 2014-07-06 NOTE — Consult Note (Signed)
PULMONARY / CRITICAL CARE MEDICINE   Name: Darren Young MRN: [id number] DOB: 17-Aug-1950    ADMISSION DATE:  07/05/2014 CONSULTATION DATE:  07/06/14  REFERRING MD :  Dr. Rockne Menghini   CHIEF COMPLAINT:  Fever / Chills  INITIAL PRESENTATION: 64 y/o M with a complex urology history admitted 2/15 with fevers & chills after dilation of urethral stricture.  2/16 am patient developed hypotension and was transferred to ICU.  PCCM consulted for evaluation.   STUDIES:   SIGNIFICANT EVENTS: 2/15  Admit with fevers, chills post urethral stricture 2/16  Tx to ICU with hypotension   HISTORY OF PRESENT ILLNESS:   64 y/o M with a complex urology history to include urethral strictures, nocturia, incomplete bladder emptying who was admitted on 2/15 per [facility] with fevers & chills after dilation of urethral stricture.  The patient was given rocephin IM prior to the procedure.  Once he arrived home he developed fever to 101.  He was nauseous and vomited x1.  The symptoms did not resolve which prompted him to seek care in the ER.  He reported bloody urine output.  He was noted to be hypotensive and tachycardic.  Labs:  Na 135, Cl 103, Cr 1.33, lactic acid 5.9, Hgb 11.3, WBC 6.1, platelets 146. On the am of 2/16, he patient developed hypotension and was transferred to ICU.  PCCM consulted for evaluation.   PAST MEDICAL HISTORY :   has a past medical history of Urethral stricture (RECURRENT); Incomplete bladder emptying; Nocturia; and Weak urinary stream.  has past surgical history that includes Cystoscopy w/ internal urethrotomy (07-24-2006  &  1996  [facility]); Cystoscopy with urethral dilatation (N/A, 09/26/2012); Cystoscopy with urethral dilatation (N/A, 04/24/2013); and Holmium laser application (N/A, 28/07/1515).    HOME MEDICATIONS:  Prior to Admission medications   Medication Sig Start Date End Date Taking? Authorizing Provider  acetaminophen (TYLENOL) 650 MG CR tablet Take 1,300 mg by mouth every 8 (eight)  hours as needed for pain (pain).   Yes Historical Provider, MD  diphenhydrAMINE (BENADRYL) 25 MG tablet Take 25 mg by mouth at bedtime.    Yes Historical Provider, MD  ibuprofen (ADVIL,MOTRIN) 200 MG tablet Take 400 mg by mouth every 6 (six) hours as needed for fever or moderate pain (fever, pain and chills).   Yes Historical Provider, MD   Allergies  Allergen Reactions  . Ciprofloxacin Rash    FAMILY HISTORY:  has no family status information on file.    SOCIAL HISTORY:  reports that he has never smoked. He has never used smokeless tobacco. He reports that he does not drink alcohol or use illicit drugs.  REVIEW OF SYSTEMS:  Currently denies -  Gen: Denies fever, chills, weight change, fatigue, night sweats HEENT: Denies blurred vision, double vision, hearing loss, tinnitus, sinus congestion, rhinorrhea, sore throat, neck stiffness, dysphagia PULM: Denies shortness of breath, cough, sputum production, hemoptysis, wheezing CV: Denies chest pain, edema, orthopnea, paroxysmal nocturnal dyspnea, palpitations GI: Denies abdominal pain, nausea, vomiting, diarrhea, hematochezia, melena, constipation, change in bowel habits GU: Denies dysuria, hematuria, polyuria, oliguria, urethral discharge Endocrine: Denies hot or cold intolerance, polyuria, polyphagia or appetite change Derm: Denies rash, dry skin, scaling or peeling skin change Heme: Denies easy bruising, bleeding, bleeding gums Neuro: Denies headache, numbness, weakness, slurred speech, loss of memory or consciousness   SUBJECTIVE:   VITAL SIGNS: Temp:  [98.1 F (36.7 C)-99.1 F (37.3 C)] 98.5 F (36.9 C) (02/16 0800) Pulse Rate:  [86-119] 86 (02/16 1100) Resp:  [  15-28] 23 (02/16 1100) BP: (81-109)/(53-68) 95/67 mmHg (02/16 1100) SpO2:  [91 %-100 %] 96 % (02/16 1100) Weight:  [206 lb (93.441 kg)] 206 lb (93.441 kg) (02/15 2313)   HEMODYNAMICS:     INTAKE / OUTPUT:  Intake/Output Summary (Last 24 hours) at 07/06/14  1137 Last data filed at 07/06/14 0410  Gross per 24 hour  Intake   2175 ml  Output   2600 ml  Net   -425 ml    PHYSICAL EXAMINATION: General:  wdwn adult male in NAD Neuro:  AAOx4, speech clear, MAE  HEENT:  MM pink/moist, no jvd Cardiovascular:  s1s2 rrr, no m/r/g, SR on monitor  Lungs:  resp's even/non-labored, lungs bilaterally mildly diminished posterior lower  Abdomen:  NTND, bsx4 active  Musculoskeletal:  No acute deformities  Skin:  Warm/dry, no edema, good cap refill  LABS:  CBC  Recent Labs Lab 07/05/14 1957 07/06/14 0510  WBC 6.1 17.6*  HGB 12.8* 11.3*  HCT 39.1 34.5*  PLT 158 146*   Coag's No results for input(s): APTT, INR in the last 168 hours.   BMET  Recent Labs Lab 07/05/14 1957 07/06/14 0510  NA 135 140  K 2.9* 4.2  CL 103 111  CO2 18* 23  BUN 16 13  CREATININE 1.33 1.11  GLUCOSE 190* 99   Electrolytes  Recent Labs Lab 07/05/14 1957 07/06/14 0510  CALCIUM 8.4 8.0*  MG  --  1.4*  PHOS  --  2.6   Sepsis Markers  Recent Labs Lab 07/06/14 0205 07/06/14 0510 07/06/14 0956  LATICACIDVEN 5.2* 2.2* 2.4*  PROCALCITON  --  37.36  --    ABG No results for input(s): PHART, PCO2ART, PO2ART in the last 168 hours.   Liver Enzymes  Recent Labs Lab 07/06/14 0510  AST 20  ALT 12  ALKPHOS 43  BILITOT 0.6  ALBUMIN 3.2*   Cardiac Enzymes  Recent Labs Lab 07/06/14 0510  TROPONINI <0.03   Glucose No results for input(s): GLUCAP in the last 168 hours.  Imaging No results found.   ASSESSMENT / PLAN:  PULMONARY OETT  A: At Risk Atlectasis  Cough / Nasal drainage - resolved, flu panel neg P:   Pulmonary hygiene:  IS Mobilize as able   CARDIOVASCULAR CVL n/a A:  Hypotension > septic shock - with presumed urinary source +/- bacteremia.  Neg troponin.   No hx HTN P:  No indication for TLC / vasopressors at present, has responded well to aggressive IVF NS @ 125 ml/hr See ID  RENAL A:   At Risk AKI - in setting of  sepsis  Lactic Acidosis  Hypokalemia  P:   Monitor BMP Replace electrolytes as indicated Trend lactate Ensure adequate volume resuscitation  GASTROINTESTINAL / GU A:   Recurrent Urethral Stricture - s/p dilation 2/15 per Dr. Junious Silk P:   Diet as tolerated  Monitor UOP  Dr. Junious Silk aware of patients admission  Avoid foley if possible   HEMATOLOGIC A:   Mild Anemia - likely component of dilution + mild bleeding post procedure 2/15  P:  Monitor CBC  INFECTIOUS A:   Sepsis - suspect urinary source s/p instrumentation with dilation +/- bacteremia  P:   UA 2/15 >> nitrite pos, 21-50 WBC, few bacteria, lg hgb BCx2 2/15 >>  UC 2/16 >>  Flu Panel 2/16 >> neg   Vanco, start date 2/15, day 2/x Zosyn, start date 2/15, day 2/x  Trend PCT (37.36 on 2/16) Follow lactic acid  ENDOCRINE  A:   No acute issues  P:   Monitor glucose on BMP  NEUROLOGIC A:   No acute issues  P:   RASS goal: n/a Monitor Post urologic procedure, may require pain control    FAMILY  - Updates: Wife Mardene Celeste - one of [employer] & patient works for facilities)  updated at bedside.      Noe Gens, NP-C [facility] Pgr: [phone] or [phone] 07/06/2014, 11:37 AM  Attending Note:  I have examined patient, reviewed labs, studies and notes. I have discussed the case with B Ollis, and I agree with the data and plans as amended above.   Baltazar Apo, MD, PhD 07/06/2014, 12:58 PM [facility] [phone] or if no answer [phone]

## 2014-07-06 NOTE — Progress Notes (Signed)
Notified K. Schorr NP of blood pressure 81/53, new orders for bolus

## 2014-07-07 DIAGNOSIS — R6521 Severe sepsis with septic shock: Secondary | ICD-10-CM

## 2014-07-07 DIAGNOSIS — R739 Hyperglycemia, unspecified: Secondary | ICD-10-CM

## 2014-07-07 DIAGNOSIS — D649 Anemia, unspecified: Secondary | ICD-10-CM

## 2014-07-07 DIAGNOSIS — E876 Hypokalemia: Secondary | ICD-10-CM

## 2014-07-07 DIAGNOSIS — A419 Sepsis, unspecified organism: Principal | ICD-10-CM

## 2014-07-07 DIAGNOSIS — E872 Acidosis: Secondary | ICD-10-CM

## 2014-07-07 LAB — URINE CULTURE
COLONY COUNT: NO GROWTH
Culture: NO GROWTH

## 2014-07-07 LAB — BASIC METABOLIC PANEL
ANION GAP: 5 (ref 5–15)
Anion gap: 3 — ABNORMAL LOW (ref 5–15)
BUN: 10 mg/dL (ref 6–23)
BUN: 9 mg/dL (ref 6–23)
CALCIUM: 8.1 mg/dL — AB (ref 8.4–10.5)
CHLORIDE: 112 mmol/L (ref 96–112)
CO2: 23 mmol/L (ref 19–32)
CO2: 24 mmol/L (ref 19–32)
CREATININE: 0.95 mg/dL (ref 0.50–1.35)
CREATININE: 0.88 mg/dL (ref 0.50–1.35)
Calcium: 8.6 mg/dL (ref 8.4–10.5)
Chloride: 115 mmol/L — ABNORMAL HIGH (ref 96–112)
GFR calc Af Amer: >90 mL/min (ref >90)
GFR calc non Af Amer: >90 mL/min (ref >90)
GFR, EST NON AFRICAN AMERICAN: 87 mL/min — AB (ref >90)
GLUCOSE: 107 mg/dL — AB (ref 70–99)
Glucose, Bld: 118 mg/dL — ABNORMAL HIGH (ref 70–99)
Potassium: 3.6 mmol/L (ref 3.5–5.1)
Potassium: 3.9 mmol/L (ref 3.5–5.1)
Sodium: 141 mmol/L (ref 135–145)
Sodium: 141 mmol/L (ref 135–145)

## 2014-07-07 LAB — CBC
HEMATOCRIT: 36 % — AB (ref 39.0–52.0)
HEMOGLOBIN: 11.7 g/dL — AB (ref 13.0–17.0)
MCH: 29.9 pg (ref 26.0–34.0)
MCHC: 32.5 g/dL (ref 30.0–36.0)
MCV: 92.1 fL (ref 78.0–100.0)
Platelets: 138 10*3/uL — ABNORMAL LOW (ref 150–400)
RBC: 3.91 MIL/uL — ABNORMAL LOW (ref 4.22–5.81)
RDW: 13.7 % (ref 11.5–15.5)
WBC: 24.9 10*3/uL — ABNORMAL HIGH (ref 4.0–10.5)

## 2014-07-07 LAB — HEMOGLOBIN A1C
Hgb A1c MFr Bld: 5.8 % — ABNORMAL HIGH (ref 4.8–5.6)
MEAN PLASMA GLUCOSE: 120 mg/dL

## 2014-07-07 LAB — PROCALCITONIN: PROCALCITONIN: 22.36 ng/mL

## 2014-07-07 LAB — VANCOMYCIN, TROUGH: Vancomycin Tr: 10.9 ug/mL (ref 10.0–20.0)

## 2014-07-07 MED ORDER — MAGNESIUM SULFATE 2 GM/50ML IV SOLN
2.0000 g | Freq: Once | INTRAVENOUS | Status: AC
  Administered 2014-07-07: 2 g via INTRAVENOUS
  Filled 2014-07-07: qty 50

## 2014-07-07 NOTE — Progress Notes (Signed)
  July 05, 2014 office urine culture preliminary result: > 100k gram-negative rods  Final sensitivities should be back tomorrow.  He is feeling much better. Eating chinese take out tonight.   A: urosepsis - improving P: will put note regarding culture results in tomorrow as soon as I see them.

## 2014-07-07 NOTE — Progress Notes (Signed)
ANTIBIOTIC CONSULT NOTE - FOLLOW UP  Pharmacy Consult for Vancomycin, Zosyn Indication: rule out sepsis  Allergies  Allergen Reactions  . Ciprofloxacin Rash    Patient Measurements: Height: 5\' 11"  (180.3 cm) Weight: 206 lb (93.441 kg) IBW/kg (Calculated) : 75.3   Vital Signs: Temp: 99.2 F (37.3 C) (02/17 0400) Temp Source: Oral (02/17 0400) BP: 106/65 mmHg (02/17 0600) Pulse Rate: 72 (02/17 0600) Intake/Output from previous day: 02/16 0701 - 02/17 0700 In: 5285 [P.O.:660; I.V.:3375; IV Piggyback:750] Out: 1900 [Urine:1900] Intake/Output from this shift:    Labs:  Recent Labs  07/05/14 1957 07/06/14 0510 07/07/14 0419  WBC 6.1 17.6*  --   HGB 12.8* 11.3*  --   PLT 158 146*  --   CREATININE 1.33 1.11 0.95   Estimated Creatinine Clearance: 94.1 mL/min (by C-G formula based on Cr of 0.95). No results for input(s): VANCOTROUGH, VANCOPEAK, VANCORANDOM, GENTTROUGH, GENTPEAK, GENTRANDOM, TOBRATROUGH, TOBRAPEAK, TOBRARND, AMIKACINPEAK, AMIKACINTROU, AMIKACIN in the last 72 hours.    Assessment: 44 yoM presented to ED on 2/15 with c/o fever and chills s/p instrumentation earlier that day by urology for urethral strictures. Patient found to be hypotensive and tachycardic in ED. Pharmacy consulted to dose Zosyn and Vancomycin.    Anti-infectives: 2/15 >> Zosyn >> 2/16 >> Vancomycin >>   Micro: 2/15 blood x 2: ngtd 2/16 urine: sent   Today, 07/07/2014  Tmax: 99.7  WBCs: elevated, 17.6 (2/16)  Renal: SCr 0.95, CrCl ~ 94 mL/min CG  Lactic Acid: 5.9 > 2.4 > 1.8  PCT: 37.36 > 22.36  Goal of Therapy:  Vancomycin trough level 15-20 mcg/ml  Appropriate abx dosing, eradication of infection.   Plan:   Continue Zosyn 3.375g IV Q8H infused over 4hrs.  Continue Vancomycin 1g IV q8h.  Measure Vanc trough at steady state, before 1600 dose today.  Follow up renal fxn, culture results, and clinical course.   Gretta Arab PharmD, BCPS Pager  [phone] 07/07/2014 8:56 AM

## 2014-07-07 NOTE — Progress Notes (Signed)
[facility] PROGRESS NOTE Interim History: 64 year old male with past medical history of urethral stricture, status post dilation and instrumentation which developed fever and chills. Patient got a dose of Rocephin prior to procedure, upon arrival to the ED he was hypotensive tachycardic. He was aggressively fluid resuscitated and admitted to the step down unit.   Assessment/Plan: Septic shock due to urinary source: - Patient is +3 L. Pressure is stable greater than 100/59, tachycardia has resolved - He was started on empiric antibiotic vancomycin and Zosyn started on 05/05/2015. Pro-calcitonin improving. - Transferred to MedSurg, consult physical therapy.  Metabolic acidosis - Due to lactic acidosis.resolved after fluid resuscitation.  Hypokalemia: - Most likely multifactorial due to vomiting and renal hypoperfusion. - Check a mag and it was low, we'll go ahead and replete  Hyperglycemia - A1c is 5.8. This most likely due to stressed.  Normocytic anemia - Mild drop in hemoglobin most likely to to hemodilution.  Thrombocytopenia: - Mild most likely due to sepsis.    History of urethral stricture      Code Status: Full. Family Communication: No family at the bedside. Disposition Plan: Home when sepsis resolved, patient hemodynamically stable, and cultures back. DVT prophylaxis: SCDs   Consultants:  Critical-care  Procedures:  Chest x-ray  Antibiotics:  Vancomycin Zosyn started on 07/05/2014  HPI/Subjective: He relates he feels much better compared to yesterday, he's been able to walk without being dizzy upon standing. - He has been tolerating his diet and has had 2 bowel movements.  Objective: Filed Vitals:   07/07/14 0200 07/07/14 0400 07/07/14 0500 07/07/14 0600  BP: 101/61 98/60 108/59 106/65  Pulse: 74 72 70 72  Temp:  99.2 F (37.3 C)    TempSrc:  Oral    Resp: 18 23 17 25   Height:      Weight:      SpO2: 94% 93% 92% 92%     Intake/Output Summary (Last 24 hours) at 07/07/14 0805 Last data filed at 07/07/14 0600  Gross per 24 hour  Intake   5160 ml  Output   1900 ml  Net   3260 ml   Filed Weights   07/05/14 2313  Weight: 93.441 kg (206 lb)    Exam:  General: Alert, awake, oriented x3, in no acute distress.  HEENT: No bruits, no goiter. -JVD Heart: Regular rate and rhythm. Lungs: Good air movement, clear Abdomen: Soft, nontender, nondistended, positive bowel sounds.  Neuro: Grossly intact, nonfocal.   Data Reviewed: Basic Metabolic Panel:  Recent Labs Lab 07/05/14 1957 07/06/14 0510  NA 135 140  K 2.9* 4.2  CL 103 111  CO2 18* 23  GLUCOSE 190* 99  BUN 16 13  CREATININE 1.33 1.11  CALCIUM 8.4 8.0*  MG  --  1.4*  PHOS  --  2.6   Liver Function Tests:  Recent Labs Lab 07/06/14 0510  AST 20  ALT 12  ALKPHOS 43  BILITOT 0.6  PROT 5.5*  ALBUMIN 3.2*   No results for input(s): LIPASE, AMYLASE in the last 168 hours. No results for input(s): AMMONIA in the last 168 hours. CBC:  Recent Labs Lab 07/05/14 1957 07/06/14 0510  WBC 6.1 17.6*  NEUTROABS 5.8 15.3*  HGB 12.8* 11.3*  HCT 39.1 34.5*  MCV 91.8 90.8  PLT 158 146*   Cardiac Enzymes:  Recent Labs Lab 07/06/14 0510  TROPONINI <0.03   BNP (last 3 results) No results for input(s): BNP in the last 8760 hours.  ProBNP (last 3  results) No results for input(s): PROBNP in the last 8760 hours.  CBG: No results for input(s): GLUCAP in the last 168 hours.  Recent Results (from the past 240 hour(s))  Culture, Urine     Status: None   Collection Time: 07/06/14  2:40 AM  Result Value Ref Range Status   Specimen Description URINE, RANDOM  Final   Special Requests NONE  Final   Colony Count NO GROWTH Performed at [facility]   Final   Culture NO GROWTH Performed at [facility]   Final   Report Status 07/07/2014 FINAL  Final  MRSA PCR Screening     Status: None   Collection Time: 07/06/14   8:28 AM  Result Value Ref Range Status   MRSA by PCR NEGATIVE NEGATIVE Final    Comment:        The GeneXpert MRSA Assay (FDA approved for NASAL specimens only), is one component of a comprehensive MRSA colonization surveillance program. It is not intended to diagnose MRSA infection nor to guide or monitor treatment for MRSA infections.      Studies: Dg Chest Port 1 View  07/06/2014   CLINICAL DATA:  Fever.  No dyspnea or chest pain.  EXAM: PORTABLE CHEST - 1 VIEW  COMPARISON:  07/22/2006  FINDINGS: A single AP portable view of the chest demonstrates no focal airspace consolidation or alveolar edema. The lungs are grossly clear. There is no large effusion or pneumothorax. Cardiac and mediastinal contours appear unremarkable.  There is no significant interval change.  IMPRESSION: No active disease.   Electronically Signed   By: Andreas Newport M.D.   On: 07/06/2014 02:57    Scheduled Meds: . sodium chloride   Intravenous Once  . diphenhydrAMINE  25 mg Oral QHS  . magnesium sulfate 1 - 4 g bolus IVPB  2 g Intravenous Once  . piperacillin-tazobactam (ZOSYN)  IV  3.375 g Intravenous Q8H  . sodium chloride  3 mL Intravenous Q12H  . vancomycin  1,000 mg Intravenous Q8H   Continuous Infusions:    Charlynne Cousins  [facility] Pager [phone]. If 8PM-8AM, please contact night-coverage at [email or web address], password [facility] 07/07/2014, 8:05 AM  LOS: 2 days

## 2014-07-07 NOTE — Progress Notes (Signed)
PULMONARY / CRITICAL CARE MEDICINE   Name: Darren Young MRN: [id number] DOB: 04-06-1951    ADMISSION DATE:  07/05/2014 CONSULTATION DATE:  07/06/14  REFERRING MD :  Dr. Rockne Menghini   CHIEF COMPLAINT:  Fever / Chills  INITIAL PRESENTATION: 64 y/o M with a complex urology history admitted 2/15 with fevers & chills after dilation of urethral stricture.  2/16 am patient developed hypotension and was transferred to ICU.  PCCM consulted for evaluation.   STUDIES:   SIGNIFICANT EVENTS: 2/15  Admit with fevers, chills post urethral stricture 2/16  Tx to ICU with hypotension   HISTORY OF PRESENT ILLNESS:   65 y/o M with a complex urology history to include urethral strictures, nocturia, incomplete bladder emptying who was admitted on 2/15 per [facility] with fevers & chills after dilation of urethral stricture.  The patient was given rocephin IM prior to the procedure.  Once he arrived home he developed fever to 101.  He was nauseous and vomited x1.  The symptoms did not resolve which prompted him to seek care in the ER.  He reported bloody urine output.  He was noted to be hypotensive and tachycardic.  Labs:  Na 135, Cl 103, Cr 1.33, lactic acid 5.9, Hgb 11.3, WBC 6.1, platelets 146. On the am of 2/16, he patient developed hypotension and was transferred to ICU.  PCCM consulted for evaluation.   SUBJECTIVE:  Hemodynamically stable Urine cx negative  VITAL SIGNS: Temp:  [98.5 F (36.9 C)-99.7 F (37.6 C)] 99.2 F (37.3 C) (02/17 0400) Pulse Rate:  [70-93] 72 (02/17 0600) Resp:  [17-34] 25 (02/17 0600) BP: (88-115)/(54-70) 106/65 mmHg (02/17 0600) SpO2:  [89 %-98 %] 92 % (02/17 0600)   HEMODYNAMICS:     INTAKE / OUTPUT:  Intake/Output Summary (Last 24 hours) at 07/07/14 7425 Last data filed at 07/07/14 0600  Gross per 24 hour  Intake   4910 ml  Output   1900 ml  Net   3010 ml    PHYSICAL EXAMINATION: General:  wdwn adult male in NAD Neuro:  AAOx4, speech clear, MAE  HEENT:  MM  pink/moist, no jvd Cardiovascular:  s1s2 rrr, no m/r/g, SR on monitor  Lungs:  resp's even/non-labored, lungs bilaterally mildly diminished posterior lower  Abdomen:  NTND, bsx4 active  Musculoskeletal:  No acute deformities  Skin:  Warm/dry, no edema, good cap refill  LABS:  CBC  Recent Labs Lab 07/05/14 1957 07/06/14 0510  WBC 6.1 17.6*  HGB 12.8* 11.3*  HCT 39.1 34.5*  PLT 158 146*   Coag's No results for input(s): APTT, INR in the last 168 hours.   BMET  Recent Labs Lab 07/05/14 1957 07/06/14 0510 07/07/14 0419  NA 135 140 141  K 2.9* 4.2 3.6  CL 103 111 115*  CO2 18* 23 23  BUN 16 13 10   CREATININE 1.33 1.11 0.95  GLUCOSE 190* 99 107*   Electrolytes  Recent Labs Lab 07/05/14 1957 07/06/14 0510 07/07/14 0419  CALCIUM 8.4 8.0* 8.1*  MG  --  1.4*  --   PHOS  --  2.6  --    Sepsis Markers  Recent Labs Lab 07/06/14 0510 07/06/14 0956 07/06/14 1850 07/07/14 0419  LATICACIDVEN 2.2* 2.4* 1.8  --   PROCALCITON 37.36  --   --  22.36   ABG No results for input(s): PHART, PCO2ART, PO2ART in the last 168 hours.   Liver Enzymes  Recent Labs Lab 07/06/14 0510  AST 20  ALT 12  ALKPHOS  82  BILITOT 0.6  ALBUMIN 3.2*   Cardiac Enzymes  Recent Labs Lab 07/06/14 0510  TROPONINI <0.03   Glucose No results for input(s): GLUCAP in the last 168 hours.  Imaging Dg Chest Port 1 View  07/06/2014   CLINICAL DATA:  Fever.  No dyspnea or chest pain.  EXAM: PORTABLE CHEST - 1 VIEW  COMPARISON:  07/22/2006  FINDINGS: A single AP portable view of the chest demonstrates no focal airspace consolidation or alveolar edema. The lungs are grossly clear. There is no large effusion or pneumothorax. Cardiac and mediastinal contours appear unremarkable.  There is no significant interval change.  IMPRESSION: No active disease.   Electronically Signed   By: Andreas Newport M.D.   On: 07/06/2014 02:57     ASSESSMENT / PLAN:  PULMONARY OETT  A: At Risk Atlectasis   Cough / Nasal drainage - resolved, flu panel neg P:   Pulmonary hygiene:  IS Mobilize as able   CARDIOVASCULAR CVL n/a A:  Hypotension > septic shock - with presumed urinary source +/- bacteremia.  Neg troponin.   No hx HTN P:  Stable, agree with decreasing IVF See ID  RENAL A:   At Risk AKI > stable Lactic Acidosis > resolved Hypokalemia > resolved P:   Monitor BMP Replace electrolytes as indicated  GASTROINTESTINAL / GU A:   Recurrent Urethral Stricture - s/p dilation 2/15 per Dr. Junious Silk P:   Diet as tolerated  No evidence anatomical complication from procedure  HEMATOLOGIC A:   Mild Anemia - likely component of dilution + mild bleeding post procedure 2/15  P:  Monitor CBC  INFECTIOUS A:   Sepsis - suspect urinary source s/p instrumentation with dilation +/- bacteremia  P:   UA 2/15 >> nitrite pos, 21-50 WBC, few bacteria, lg hgb BCx2 2/15 >>  UC 2/16 >> negative Flu Panel 2/16 >> neg   Vanco, start date 2/15, day 2/x Zosyn, start date 2/15, day 2/x  Would recommend d/c vanco; narrow abx as able to PO regimen. Note prior urine cx with klebsiella (R to nitrofurantoin, I to unasyn)  ENDOCRINE A:   No acute issues  P:   Monitor glucose on BMP  NEUROLOGIC A:   No acute issues  P:   RASS goal: n/a Monitor pain control    PCCM will sign off. Please call us if we can assist you    Baltazar Apo, MD, PhD 07/07/2014, 9:09 AM [facility] [phone] or if no answer [phone]

## 2014-07-08 LAB — CBC
HCT: 35.6 % — ABNORMAL LOW (ref 39.0–52.0)
Hemoglobin: 11.5 g/dL — ABNORMAL LOW (ref 13.0–17.0)
MCH: 30 pg (ref 26.0–34.0)
MCHC: 32.3 g/dL (ref 30.0–36.0)
MCV: 93 fL (ref 78.0–100.0)
PLATELETS: 161 10*3/uL (ref 150–400)
RBC: 3.83 MIL/uL — ABNORMAL LOW (ref 4.22–5.81)
RDW: 13.6 % (ref 11.5–15.5)
WBC: 21.9 10*3/uL — ABNORMAL HIGH (ref 4.0–10.5)

## 2014-07-08 LAB — PROCALCITONIN: Procalcitonin: 10.44 ng/mL

## 2014-07-08 LAB — MAGNESIUM: Magnesium: 1.8 mg/dL (ref 1.5–2.5)

## 2014-07-08 MED ORDER — SULFAMETHOXAZOLE-TRIMETHOPRIM 800-160 MG PO TABS: 1.0000 | ORAL_TABLET | Freq: Two times a day (BID) | ORAL | Status: DC

## 2014-07-08 MED ORDER — SULFAMETHOXAZOLE-TRIMETHOPRIM 800-160 MG PO TABS: 1.0000 | ORAL_TABLET | Freq: Two times a day (BID) | ORAL | Status: AC

## 2014-07-08 NOTE — Progress Notes (Signed)
[facility] PROGRESS NOTE Interim History: 64 year old male with past medical history of urethral stricture, status post dilation and instrumentation which developed fever and chills. Patient got a dose of Rocephin prior to procedure, upon arrival to the ED he was hypotensive tachycardic. He was aggressively fluid resuscitated and admitted to the step down unit.   Assessment/Plan: Septic shock due to urinary source: - Patient vital signs are stable. - He was started on empiric antibiotic vancomycin and Zosyn started on 05/05/2015. Pro-calcitonin improving. - His MRSA screening test was negative, vancomycin was DC'd. - Has remained afebrile leukocytosis improving. - Cultures continue to be pending. Hopefully the cultures from the urologist office will be positive.  Metabolic acidosis - Due to lactic acidosis. - Resolved after fluid resuscitation.  Hypokalemia: - Most likely multifactorial due to vomiting and renal hypoperfusion. - Resolved with repletion.  Hyperglycemia - A1c is 5.8. This most likely due to stressed.  Normocytic anemia - Mild drop in hemoglobin most likely to to hemodilution.  Thrombocytopenia: - Mild most likely due to sepsis. - Now resolved.  History of urethral stricture      Code Status: Full. Family Communication: No family at the bedside. Disposition Plan: Home when sepsis resolved, patient hemodynamically stable, and cultures back. DVT prophylaxis: SCDs   Consultants:  Critical-care  Procedures:  Chest x-ray  Antibiotics:  Vancomycin Zosyn started on 07/05/2014  HPI/Subjective: He relates he feels better.  Objective: Filed Vitals:   07/07/14 1213 07/07/14 1400 07/07/14 2114 07/08/14 0542  BP: 118/80 122/77 125/80 134/88  Pulse: 61 73 65 61  Temp: 98.3 F (36.8 C) 97.1 F (36.2 C) 98.9 F (37.2 C) 98.5 F (36.9 C)  TempSrc: Oral Oral Oral Oral  Resp: 18 20 18 20   Height: 5\' 11"  (1.803 m)     Weight: 104.3 kg (229  lb 15 oz)     SpO2: 96% 98% 96% 96%    Intake/Output Summary (Last 24 hours) at 07/08/14 1049 Last data filed at 07/08/14 0843  Gross per 24 hour  Intake    840 ml  Output   1175 ml  Net   -335 ml   Filed Weights   07/05/14 2313 07/07/14 1213  Weight: 93.441 kg (206 lb) 104.3 kg (229 lb 15 oz)    Exam:  General: Alert, awake, oriented x3, in no acute distress.  HEENT: No bruits, no goiter. -JVD Heart: Regular rate and rhythm. Lungs: Good air movement, clear Abdomen: Soft, nontender, nondistended, positive bowel sounds.  Neuro: Grossly intact, nonfocal.   Data Reviewed: Basic Metabolic Panel:  Recent Labs Lab 07/05/14 1957 07/06/14 0510 07/07/14 0419 07/07/14 0930 07/08/14 0541  NA 135 140 141 141  --   K 2.9* 4.2 3.6 3.9  --   CL 103 111 115* 112  --   CO2 18* 23 23 24   --   GLUCOSE 190* 99 107* 118*  --   BUN 16 13 10 9   --   CREATININE 1.33 1.11 0.95 0.88  --   CALCIUM 8.4 8.0* 8.1* 8.6  --   MG  --  1.4*  --   --  1.8  PHOS  --  2.6  --   --   --    Liver Function Tests:  Recent Labs Lab 07/06/14 0510  AST 20  ALT 12  ALKPHOS 43  BILITOT 0.6  PROT 5.5*  ALBUMIN 3.2*   No results for input(s): LIPASE, AMYLASE in the last 168 hours. No results for input(s):  AMMONIA in the last 168 hours. CBC:  Recent Labs Lab 07/05/14 1957 07/06/14 0510 07/07/14 0930 07/08/14 1021  WBC 6.1 17.6* 24.9* 21.9*  NEUTROABS 5.8 15.3*  --   --   HGB 12.8* 11.3* 11.7* 11.5*  HCT 39.1 34.5* 36.0* 35.6*  MCV 91.8 90.8 92.1 93.0  PLT 158 146* 138* 161   Cardiac Enzymes:  Recent Labs Lab 07/06/14 0510  TROPONINI <0.03   BNP (last 3 results) No results for input(s): BNP in the last 8760 hours.  ProBNP (last 3 results) No results for input(s): PROBNP in the last 8760 hours.  CBG: No results for input(s): GLUCAP in the last 168 hours.  Recent Results (from the past 240 hour(s))  Culture, blood (routine x 2)     Status: None (Preliminary result)    Collection Time: 07/05/14  8:31 PM  Result Value Ref Range Status   Specimen Description BLOOD RIGHT ANTECUBITAL  Final   Special Requests BOTTLES DRAWN AEROBIC AND ANAEROBIC 5ML  Final   Culture   Final           BLOOD CULTURE RECEIVED NO GROWTH TO DATE CULTURE WILL BE HELD FOR 5 DAYS BEFORE ISSUING A FINAL NEGATIVE REPORT Performed at [facility]    Report Status PENDING  Incomplete  Culture, blood (routine x 2)     Status: None (Preliminary result)   Collection Time: 07/05/14  8:35 PM  Result Value Ref Range Status   Specimen Description BLOOD LEFT ARM  Final   Special Requests BOTTLES DRAWN AEROBIC AND ANAEROBIC 5CC  Final   Culture   Final           BLOOD CULTURE RECEIVED NO GROWTH TO DATE CULTURE WILL BE HELD FOR 5 DAYS BEFORE ISSUING A FINAL NEGATIVE REPORT Performed at [facility]    Report Status PENDING  Incomplete  Culture, Urine     Status: None   Collection Time: 07/06/14  2:40 AM  Result Value Ref Range Status   Specimen Description URINE, RANDOM  Final   Special Requests NONE  Final   Colony Count NO GROWTH Performed at [facility]   Final   Culture NO GROWTH Performed at [facility]   Final   Report Status 07/07/2014 FINAL  Final  MRSA PCR Screening     Status: None   Collection Time: 07/06/14  8:28 AM  Result Value Ref Range Status   MRSA by PCR NEGATIVE NEGATIVE Final    Comment:        The GeneXpert MRSA Assay (FDA approved for NASAL specimens only), is one component of a comprehensive MRSA colonization surveillance program. It is not intended to diagnose MRSA infection nor to guide or monitor treatment for MRSA infections.      Studies: No results found.  Scheduled Meds: . sodium chloride   Intravenous Once  . diphenhydrAMINE  25 mg Oral QHS  . piperacillin-tazobactam (ZOSYN)  IV  3.375 g Intravenous Q8H  . sodium chloride  3 mL Intravenous Q12H  . vancomycin  1,000 mg Intravenous Q8H   Continuous  Infusions:    Charlynne Cousins  [facility] Pager [phone]. If 8PM-8AM, please contact night-coverage at [email or web address], password [facility] 07/08/2014, 10:49 AM  LOS: 3 days

## 2014-07-08 NOTE — Evaluation (Addendum)
Physical Therapy Evaluation Patient Details Name: Darren Young MRN: [id number] DOB: [birth year] Today's Date: 07/08/2014   History of Present Illness  64 yo male admitted with sepsis.   Clinical Impression  On eval, pt was Mod Ind with all activities/mobility-able to ambulate ~400 feet and climb 4 steps without difficulty. Pt able to stand and perform hygiene tasks without LOB. No acute or follow up PT needs. 1x eval  Will sign off.     Follow Up Recommendations No PT follow up    Equipment Recommendations  None recommended by PT    Recommendations for Other Services       Precautions / Restrictions Precautions Precautions: None Restrictions Weight Bearing Restrictions: No      Mobility  Bed Mobility Overal bed mobility: Independent                Transfers Overall transfer level: Independent                  Ambulation/Gait Ambulation/Gait assistance: Modified independent (Device/Increase time) Ambulation Distance (Feet): 400 Feet Assistive device: None Gait Pattern/deviations: WFL(Within Functional Limits)        Stairs Stairs: Yes Stairs assistance: Modified independent (Device/Increase time) Stair Management: One rail Right;Step to pattern;Forwards Number of Stairs: 4    Wheelchair Mobility    Modified Rankin (Stroke Patients Only)       Balance Overall balance assessment: No apparent balance deficits (not formally assessed)                                           Pertinent Vitals/Pain Pain Assessment: No/denies pain    Home Living Family/patient expects to be discharged to:: Private residence Living Arrangements: Spouse/significant other Available Help at Discharge: Family           Home Equipment: None      Prior Function Level of Independence: Independent               Hand Dominance        Extremity/Trunk Assessment   Upper Extremity Assessment: Overall WFL for tasks assessed            Lower Extremity Assessment: Overall WFL for tasks assessed      Cervical / Trunk Assessment: Normal  Communication   Communication: No difficulties  Cognition Arousal/Alertness: Awake/alert Behavior During Therapy: WFL for tasks assessed/performed Overall Cognitive Status: Within Functional Limits for tasks assessed                      General Comments      Exercises        Assessment/Plan    PT Assessment Patent does not need any further PT services  PT Diagnosis Difficulty walking   PT Problem List    PT Treatment Interventions     PT Goals (Current goals can be found in the Care Plan section) Acute Rehab PT Goals Patient Stated Goal: regain independence PT Goal Formulation: All assessment and education complete, DC therapy    Frequency     Barriers to discharge        Co-evaluation               End of Session   Activity Tolerance: Patient tolerated treatment well Patient left: in chair;with call bell/phone within reach           Time: 1941-7408 PT Time  Calculation (min) (ACUTE ONLY): 19 min   Charges:   PT Evaluation $Initial PT Evaluation Tier I: 1 Procedure     PT G Codes:        Weston Anna, MPT Pager: [phone]

## 2014-07-08 NOTE — Discharge Summary (Signed)
Physician Discharge Summary  Darren Young VFI:[id number] DOB: 1951/03/12 DOA: 07/05/2014  PCP: Lujean Amel, MD  Admit date: 07/05/2014 Discharge date: 07/08/2014  Time spent: 30 minutes  Recommendations for Outpatient Follow-up:  1. Follow-up with urology in one week as an outpatient.    Discharge Diagnoses:  Principal Problem:   Sepsis Active Problems:   Normocytic anemia   History of urethral stricture   Septic shock   Metabolic acidosis   Hypokalemia   Hyperglycemia   Discharge Condition: stable  Diet recommendation: regular  Filed Weights   07/05/14 2313 07/07/14 1213  Weight: 93.441 kg (206 lb) 104.3 kg (229 lb 15 oz)    History of present illness:  64 y.o. male history of urethral strictures with had a procedure to dilate the stricture started developing fever and chills after reaching home. Patient was given Rocephin intramuscular one dose prior to the procedure. Patient states he also had a fever of 101F at home. Since he had persistent symptoms he came to the ER and was found to be hypotensive and tachycardic. In the ER he was afebrile though. Patient states he also had voided urine which was bloody. Patient otherwise denies any shortness of breath chest pain diarrhea though he had one episode of vomiting. He has been having some upper respiratory tract infection symptoms for last few days. Patient was given fluid boluses and admitted for sepsis from urinary source. Dr. Junious Silk was notified by ER physician about patient's admission.   Hospital Course:  Septic shock due to urinary source: - He was given of aggressive fluid resuscitation, was started empirically on vancomycin and Zosyn, lactic acidosis resolved. - He was started on empiric antibiotic vancomycin and Zosyn started on 05/05/2015.  - His MRSA screening test was negative, vancomycin was DC'd. - Has remained afebrile leukocytosis improving. - Cultures from Dr. Junious Silk office came back Klebsiella  sensitive to Bactrim. - We'll go home a 7 day course of Bactrim.  Metabolic acidosis - Due to lactic acidosis. - Resolved after fluid resuscitation.  Hypokalemia: - Most likely multifactorial due to vomiting and renal hypoperfusion. - Resolved with repletion.  Hyperglycemia - A1c is 5.8. This most likely due to stressed.  Normocytic anemia - Mild drop in hemoglobin most likely to to hemodilution.  Thrombocytopenia: - Mild most likely due to sepsis. - Now resolved.   Procedures:  Chest x-ray  Consultations:  Urology  Discharge Exam: Filed Vitals:   07/08/14 1438  BP: 134/87  Pulse: 69  Temp: 98 F (36.7 C)  Resp: 22    General: Awake alert and oriented 3 Cardiovascular: Regular rate and rhythm Respiratory: Good air movement clear to auscultation  Discharge Instructions   Discharge Instructions    Diet - low sodium heart healthy    Complete by:  As directed      Increase activity slowly    Complete by:  As directed           Current Discharge Medication List    START taking these medications   Details  sulfamethoxazole-trimethoprim (BACTRIM DS,SEPTRA DS) 800-160 MG per tablet Take 1 tablet by mouth 2 (two) times daily. Qty: 14 tablet, Refills: 0      CONTINUE these medications which have NOT CHANGED   Details  acetaminophen (TYLENOL) 650 MG CR tablet Take 1,300 mg by mouth every 8 (eight) hours as needed for pain (pain).    diphenhydrAMINE (BENADRYL) 25 MG tablet Take 25 mg by mouth at bedtime.     ibuprofen (ADVIL,MOTRIN) 200  MG tablet Take 400 mg by mouth every 6 (six) hours as needed for fever or moderate pain (fever, pain and chills).       Allergies  Allergen Reactions  . Ciprofloxacin Rash      The results of significant diagnostics from this hospitalization (including imaging, microbiology, ancillary and laboratory) are listed below for reference.    Significant Diagnostic Studies: Dg Chest Port 1 View  07/06/2014   CLINICAL  DATA:  Fever.  No dyspnea or chest pain.  EXAM: PORTABLE CHEST - 1 VIEW  COMPARISON:  07/22/2006  FINDINGS: A single AP portable view of the chest demonstrates no focal airspace consolidation or alveolar edema. The lungs are grossly clear. There is no large effusion or pneumothorax. Cardiac and mediastinal contours appear unremarkable.  There is no significant interval change.  IMPRESSION: No active disease.   Electronically Signed   By: Andreas Newport M.D.   On: 07/06/2014 02:57    Microbiology: Recent Results (from the past 240 hour(s))  Culture, blood (routine x 2)     Status: None (Preliminary result)   Collection Time: 07/05/14  8:31 PM  Result Value Ref Range Status   Specimen Description BLOOD RIGHT ANTECUBITAL  Final   Special Requests BOTTLES DRAWN AEROBIC AND ANAEROBIC 5ML  Final   Culture   Final           BLOOD CULTURE RECEIVED NO GROWTH TO DATE CULTURE WILL BE HELD FOR 5 DAYS BEFORE ISSUING A FINAL NEGATIVE REPORT Performed at [facility]    Report Status PENDING  Incomplete  Culture, blood (routine x 2)     Status: None (Preliminary result)   Collection Time: 07/05/14  8:35 PM  Result Value Ref Range Status   Specimen Description BLOOD LEFT ARM  Final   Special Requests BOTTLES DRAWN AEROBIC AND ANAEROBIC 5CC  Final   Culture   Final           BLOOD CULTURE RECEIVED NO GROWTH TO DATE CULTURE WILL BE HELD FOR 5 DAYS BEFORE ISSUING A FINAL NEGATIVE REPORT Performed at [facility]    Report Status PENDING  Incomplete  Culture, Urine     Status: None   Collection Time: 07/06/14  2:40 AM  Result Value Ref Range Status   Specimen Description URINE, RANDOM  Final   Special Requests NONE  Final   Colony Count NO GROWTH Performed at [facility]   Final   Culture NO GROWTH Performed at [facility]   Final   Report Status 07/07/2014 FINAL  Final  MRSA PCR Screening     Status: None   Collection Time: 07/06/14  8:28 AM  Result Value Ref  Range Status   MRSA by PCR NEGATIVE NEGATIVE Final    Comment:        The GeneXpert MRSA Assay (FDA approved for NASAL specimens only), is one component of a comprehensive MRSA colonization surveillance program. It is not intended to diagnose MRSA infection nor to guide or monitor treatment for MRSA infections.      Labs: Basic Metabolic Panel:  Recent Labs Lab 07/05/14 1957 07/06/14 0510 07/07/14 0419 07/07/14 0930 07/08/14 0541  NA 135 140 141 141  --   K 2.9* 4.2 3.6 3.9  --   CL 103 111 115* 112  --   CO2 18* 23 23 24   --   GLUCOSE 190* 99 107* 118*  --   BUN 16 13 10 9   --  CREATININE 1.33 1.11 0.95 0.88  --   CALCIUM 8.4 8.0* 8.1* 8.6  --   MG  --  1.4*  --   --  1.8  PHOS  --  2.6  --   --   --    Liver Function Tests:  Recent Labs Lab 07/06/14 0510  AST 20  ALT 12  ALKPHOS 43  BILITOT 0.6  PROT 5.5*  ALBUMIN 3.2*   No results for input(s): LIPASE, AMYLASE in the last 168 hours. No results for input(s): AMMONIA in the last 168 hours. CBC:  Recent Labs Lab 07/05/14 1957 07/06/14 0510 07/07/14 0930 07/08/14 1021  WBC 6.1 17.6* 24.9* 21.9*  NEUTROABS 5.8 15.3*  --   --   HGB 12.8* 11.3* 11.7* 11.5*  HCT 39.1 34.5* 36.0* 35.6*  MCV 91.8 90.8 92.1 93.0  PLT 158 146* 138* 161   Cardiac Enzymes:  Recent Labs Lab 07/06/14 0510  TROPONINI <0.03   BNP: BNP (last 3 results) No results for input(s): BNP in the last 8760 hours.  ProBNP (last 3 results) No results for input(s): PROBNP in the last 8760 hours.  CBG: No results for input(s): GLUCAP in the last 168 hours.     Signed:  Charlynne Cousins  [facility] 07/08/2014, 2:58 PM

## 2014-07-08 NOTE — Progress Notes (Signed)
[facility] accession # [id number] [facility]  Results Urine culture Collected July 05, 2014 Resulted July 08, 2014  Colony count > 100,000 Colonies per mL Klebsiella oxytoca  Sensitivities: Ampicillin resistant Amoxicillin/clavulanic sensitive Ampicillin/sul indeterminate Piperacillin/tazo sensitive Imipenem sensitive Cefazolin sensitive Ceftriaxone sensitive Ceftazidime sensitive Cefepime sensitive Gentamicin sensitive Tobramycin sensitive Ciprofloxacin sensitive Levofloxacin sensitive Nitrofurantoin resistant Trimeth/sulfa sensitive   Urine culture results posted above.  Patient feeling well today.  Voiding without difficulty.Follow white count and when stable consider transitioned to by mouth with 10-14 days of treatment.  I will let patient do CIC tonight.

## 2014-07-08 NOTE — Consult Note (Signed)
Came to visit patient at bedside on behalf of [facility] program. Patient declines having any needs. Appreciative of visit however. Left brochure at bedside.  Marthenia Rolling, MSN- RN,BSN- [facility] [phone]

## 2014-07-12 LAB — CULTURE, BLOOD (ROUTINE X 2)
Culture: NO GROWTH
Culture: NO GROWTH

## 2014-10-12 ENCOUNTER — Other Ambulatory Visit: Payer: Self-pay | Admitting: *Deleted

## 2014-10-12 NOTE — Patient Outreach (Signed)
Called [employer] member, f/u on benefit exception request made, HIPPA verified on member (have [facility] consent).   Informed  [facility] member this RN CM will be working on his request (urethroplasty to be done at [facility] in the next 12 weeks- not covered in the [facility] system facilities).   Also informed [facility] member [facility] CM assistant will be sending request letters to MDs for updated information as same  benefit exception request was also made last year (February 2015- procedure not done then).   [facility] member states he does not anticipate any changes.   RN CM provided [facility] member with contact number to call if needed.     [name].   [place] [facility]  [phone]

## 2014-11-15 ENCOUNTER — Other Ambulatory Visit: Payer: Self-pay | Admitting: *Deleted

## 2014-11-15 NOTE — Patient Outreach (Signed)
Attempt made to contact [employer] member on his cell phone, f/u on Benefit exception request as attempt was also made today by RN CM on member's home phone.    HIPPA compliant voice message left with contact number.     [name].   [place] [facility]  [phone]

## 2014-11-15 NOTE — Patient Outreach (Signed)
Attempt made to contact [employer] member on home phone,  f/u on benefit exception request.   HIPPA compliant voice message left with contact number.   Plan to try [employer] member's cell phone.       [name].   [place] [facility]  [phone]

## 2014-11-15 NOTE — Patient Outreach (Signed)
Received a return phone call from [facility] member, HIPPA verified, to voice message left earlier by RN CM. RN CM discussed with [employer] member benefit exception request (coverage of urethral stricture repair procedures- to be done at [facility]) to which member states  going in  tomorrow for xray to look at scar tissue in urethral.  Member reports depending on the amount of scar tissue that needs removal, if small- will go ahead with procedure, if large amount of scar tissue- do graft from jaw.   Member states they will tell him if he needs the graft.    Member inquired about procedure done 5/16 (cystostomy with drainage for urethral stricture), already paid for it and did he pay too much.   RN CM discussed with member procedure done on 5/16 can be included with benefit exception request  along with other procedure, do  retroactive.  Informed member benefit exception request will be for coverage as if in network (procedures 80%).   [facility] member to f/u with RN CM, let her know  outcome of xray, scheduling of procedure to which RN CM will then proceed with benefit exception request.      [name].   [place] [facility]  [phone]

## 2014-11-23 ENCOUNTER — Other Ambulatory Visit: Payer: Self-pay | Admitting: *Deleted

## 2014-11-23 NOTE — Patient Outreach (Addendum)
Received a call from [facility] member, states he is scheduled for surgery (urethroplasty) on 7/11 with Dr. Nicola Girt at [place] discussed with [facility] member to work on presenting benefit exception.       Plan to work on benefit exception request, keep [facility] member updated.      [name].   [place] [facility]  [phone]

## 2014-11-23 NOTE — Patient Outreach (Signed)
F/u phone call:  RN CM called [employer] member back  to inquire if graft will be done during upcoming surgery (Urethroplasty) on 7/11 as last conversation with [facility] member on 6/27- was to have xray done 6/28- let him know if graft is needed. Member states surgeon did not think would have to do graft, but if found needed the graft- will do during the urethroplasty.     Plan to work on benefit exception request for [facility] member.     [name].   [place] [facility]  [phone]

## 2014-12-14 ENCOUNTER — Other Ambulatory Visit: Payer: Self-pay | Admitting: *Deleted

## 2014-12-14 NOTE — Patient Outreach (Signed)
Received a return phone call from [facility] member to voice message left earlier by RN CM.   Discussed with [employer] member spoke with coworker Bary Castilla), was informed having xray done in [facility] system is  cheaper and results can be sent to MD.   Jacqlyn Krauss member states xray is a bladder type, a specialty with Urology,not sure if  [facility] does it.   [facility] member states he will call.     [name].   [place] [facility]  [phone]

## 2014-12-14 NOTE — Patient Outreach (Signed)
Spoke with [employer] member, relayed benefit exception request was approved at 80% coverage for hospital stays/60 % coverage for MD for dates 5/16 (cystostomy with drainage for urethral stricture)  and 7/11 (urethroplasty) for surgeries at [facility] center.   [facility] member relayed xray is scheduled for 8/16 at [facility], then to f/u with MD the same day.   This RN CM will f/u on this (xray) as far as coverage and get back to [facility] member.    [name].   [place] [facility]  [phone]

## 2014-12-14 NOTE — Patient Outreach (Signed)
Attempt made to contact [employer] member to discuss  coverage of xray.   HIPPA  Compliant voice message left with contact number.      [name].   [place] [facility]  [phone]

## 2015-03-16 ENCOUNTER — Encounter: Payer: Self-pay | Admitting: *Deleted

## 2015-03-16 NOTE — Patient Outreach (Signed)
[facility]) Care Management  03/16/2015  Darren Young [birth year] [id number]   Documentation: Late entry-  Plan to close case.  Last phone call with [employer] member was 7/26- RN CM informed  member benefit exception was approved and f/u call same day -provided advice on f/u xray.     No further case management needs.   Plan to inform [name] [facility] CMA to close case, no further case management needs.    [name].   [place] [facility]  [phone]

## 2015-05-13 ENCOUNTER — Encounter: Payer: Self-pay | Admitting: Family Medicine

## 2015-05-27 DIAGNOSIS — N529 Male erectile dysfunction, unspecified: Secondary | ICD-10-CM | POA: Diagnosis not present

## 2015-05-27 DIAGNOSIS — N35013 Post-traumatic anterior urethral stricture: Secondary | ICD-10-CM | POA: Diagnosis not present

## 2015-05-27 MED FILL — LEVITRA 20 MG TABLET: 20 | 30 days supply | Qty: 6 | Fill #0

## 2015-05-30 MED FILL — SULFAMETHOXAZOLE/TMP DS TAB: 800-160 | 10 days supply | Qty: 20 | Fill #0

## 2015-06-13 DIAGNOSIS — N359 Urethral stricture, unspecified: Secondary | ICD-10-CM | POA: Diagnosis not present

## 2015-09-12 DIAGNOSIS — N529 Male erectile dysfunction, unspecified: Secondary | ICD-10-CM | POA: Diagnosis not present

## 2015-09-12 DIAGNOSIS — Z09 Encounter for follow-up examination after completed treatment for conditions other than malignant neoplasm: Secondary | ICD-10-CM | POA: Diagnosis not present

## 2015-09-12 DIAGNOSIS — N35013 Post-traumatic anterior urethral stricture: Secondary | ICD-10-CM | POA: Diagnosis not present

## 2015-09-14 MED FILL — AMOX-CLAV 250-125 MG TABLET: 250-125 | 7 days supply | Qty: 21 | Fill #0

## 2015-09-28 IMAGING — DX DG CHEST 1V PORT
1 series · 1 of 1 positions shown · non-contrast
Comparison: 07/22/2006

CLINICAL DATA: Fever.  No dyspnea or chest pain.

EXAM:
PORTABLE CHEST - 1 VIEW

[chest ap]
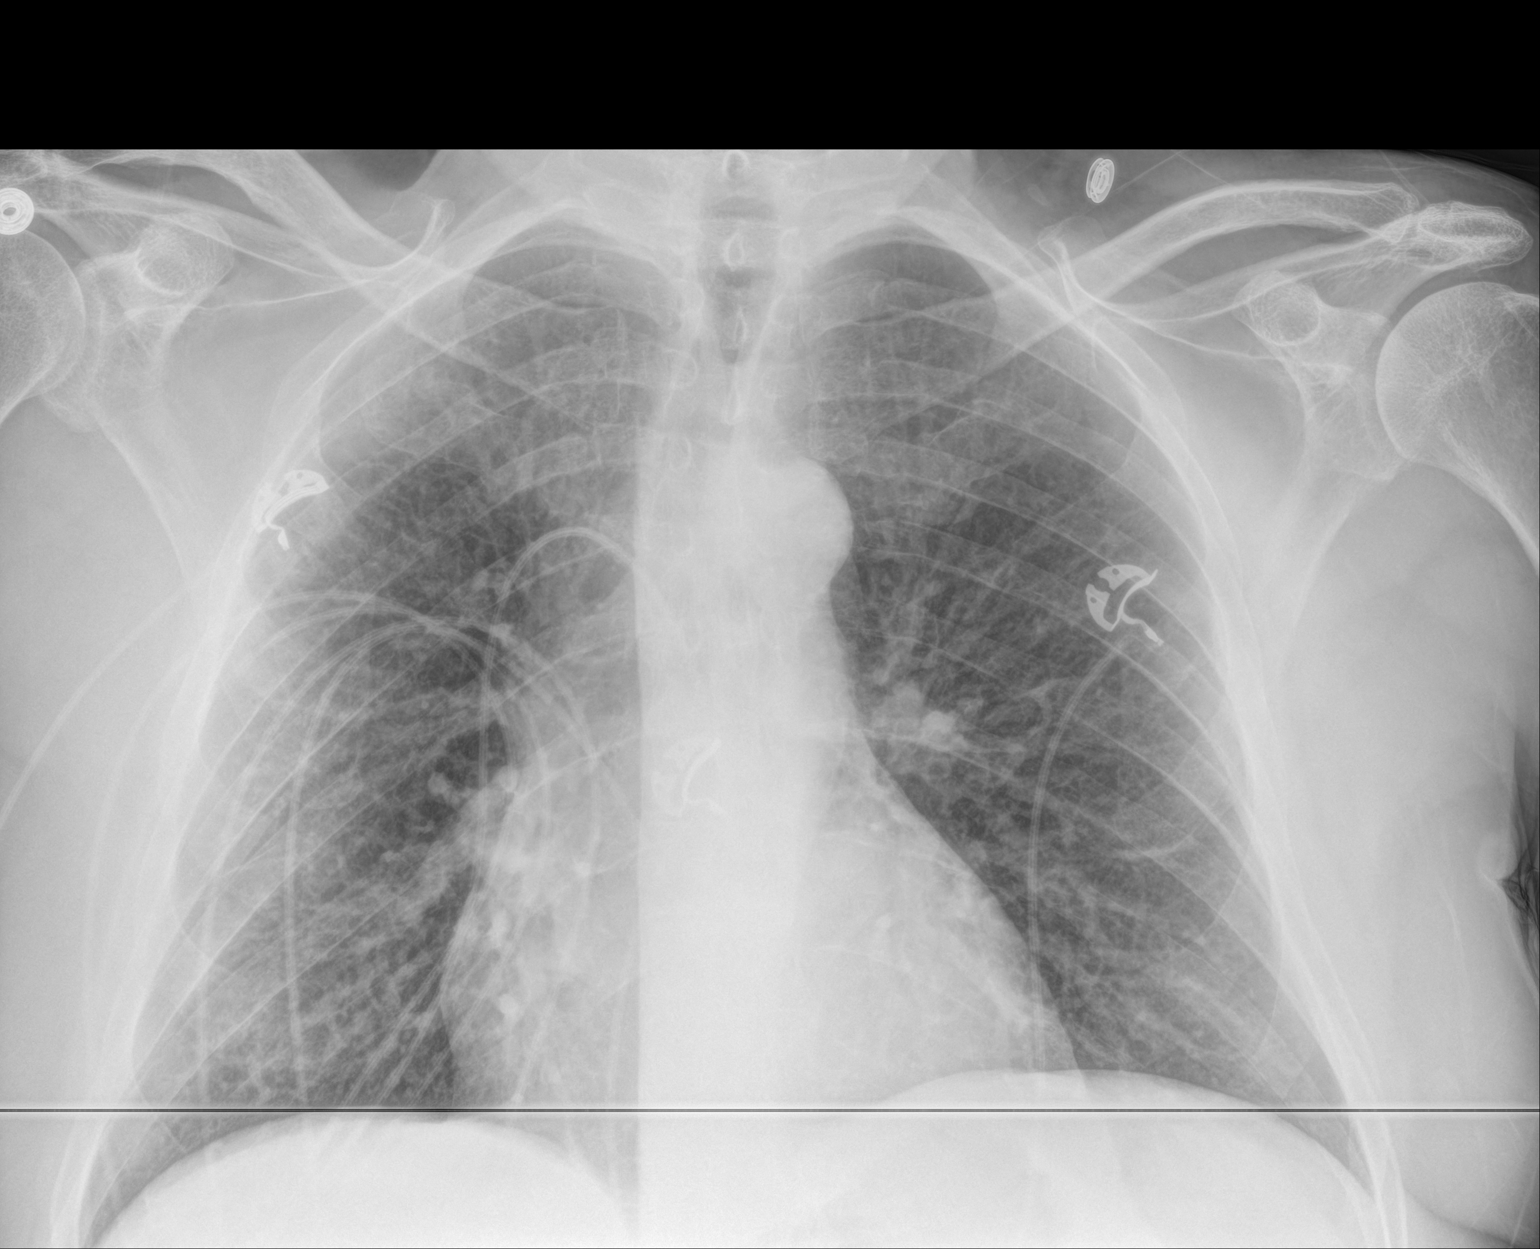

[1 of 1 positions shown; findings below may reference images not displayed]

FINDINGS: A single AP portable view of the chest demonstrates no focal
airspace consolidation or alveolar edema. The lungs are grossly
clear. There is no large effusion or pneumothorax. Cardiac and
mediastinal contours appear unremarkable.

There is no significant interval change.
IMPRESSION: No active disease.

## 2016-04-09 DIAGNOSIS — R8290 Unspecified abnormal findings in urine: Secondary | ICD-10-CM | POA: Diagnosis not present

## 2016-04-09 DIAGNOSIS — N528 Other male erectile dysfunction: Secondary | ICD-10-CM | POA: Diagnosis not present

## 2016-04-09 DIAGNOSIS — N35011 Post-traumatic bulbous urethral stricture: Secondary | ICD-10-CM | POA: Diagnosis not present

## 2016-04-13 MED FILL — AMOX-CLAV 500-125 MG TABLET: 500-125 | 7 days supply | Qty: 21 | Fill #0

## 2016-07-16 DIAGNOSIS — H5203 Hypermetropia, bilateral: Secondary | ICD-10-CM | POA: Diagnosis not present

## 2016-07-25 DIAGNOSIS — L309 Dermatitis, unspecified: Secondary | ICD-10-CM | POA: Diagnosis not present

## 2016-07-25 DIAGNOSIS — Z0000 Encounter for general adult medical examination without abnormal findings: Secondary | ICD-10-CM | POA: Diagnosis not present

## 2016-07-25 DIAGNOSIS — Z13220 Encounter for screening for lipoid disorders: Secondary | ICD-10-CM | POA: Diagnosis not present

## 2016-07-26 MED FILL — MOMETASONE FUROATE 0.1% OIN: 0.1 | 30 days supply | Qty: 45 | Fill #0

## 2016-09-12 ENCOUNTER — Encounter: Payer: Self-pay | Admitting: Family Medicine

## 2017-04-15 DIAGNOSIS — N528 Other male erectile dysfunction: Secondary | ICD-10-CM | POA: Diagnosis not present

## 2017-04-15 DIAGNOSIS — N35014 Post-traumatic urethral stricture, male, unspecified: Secondary | ICD-10-CM | POA: Diagnosis not present

## 2017-04-15 DIAGNOSIS — N138 Other obstructive and reflux uropathy: Secondary | ICD-10-CM | POA: Diagnosis not present

## 2017-04-15 DIAGNOSIS — N529 Male erectile dysfunction, unspecified: Secondary | ICD-10-CM | POA: Diagnosis not present

## 2017-04-15 DIAGNOSIS — N401 Enlarged prostate with lower urinary tract symptoms: Secondary | ICD-10-CM | POA: Diagnosis not present

## 2017-07-29 DIAGNOSIS — Z13220 Encounter for screening for lipoid disorders: Secondary | ICD-10-CM | POA: Diagnosis not present

## 2017-07-29 DIAGNOSIS — Z23 Encounter for immunization: Secondary | ICD-10-CM | POA: Diagnosis not present

## 2017-07-29 DIAGNOSIS — Z0000 Encounter for general adult medical examination without abnormal findings: Secondary | ICD-10-CM | POA: Diagnosis not present

## 2017-08-08 DIAGNOSIS — D1801 Hemangioma of skin and subcutaneous tissue: Secondary | ICD-10-CM | POA: Diagnosis not present

## 2017-08-08 DIAGNOSIS — L308 Other specified dermatitis: Secondary | ICD-10-CM | POA: Diagnosis not present

## 2017-08-08 DIAGNOSIS — L821 Other seborrheic keratosis: Secondary | ICD-10-CM | POA: Diagnosis not present

## 2017-08-08 DIAGNOSIS — D225 Melanocytic nevi of trunk: Secondary | ICD-10-CM | POA: Diagnosis not present

## 2017-08-08 MED FILL — TRIAMCINOLONE ACETONIDE 0.1: 0.1 | 30 days supply | Qty: 454 | Fill #0

## 2017-08-21 ENCOUNTER — Encounter: Payer: Self-pay | Admitting: Family Medicine

## 2017-12-23 DIAGNOSIS — N35014 Post-traumatic urethral stricture, male, unspecified: Secondary | ICD-10-CM | POA: Diagnosis not present

## 2017-12-23 DIAGNOSIS — N5201 Erectile dysfunction due to arterial insufficiency: Secondary | ICD-10-CM | POA: Diagnosis not present

## 2017-12-24 MED FILL — SILDENAFIL CITRATE 20 MG TA: 20 | 10 days supply | Qty: 50 | Fill #0

## 2017-12-25 MED FILL — SULFAMETHOXAZOLE-TMP DS TAB: 800-160 | 10 days supply | Qty: 20 | Fill #0

## 2018-02-12 MED FILL — SILDENAFIL CITRATE 20 MG TA: 20 | 10 days supply | Qty: 50 | Fill #1

## 2018-06-23 DIAGNOSIS — N35014 Post-traumatic urethral stricture, male, unspecified: Secondary | ICD-10-CM | POA: Diagnosis not present

## 2018-06-23 DIAGNOSIS — N401 Enlarged prostate with lower urinary tract symptoms: Secondary | ICD-10-CM | POA: Diagnosis not present

## 2018-06-23 DIAGNOSIS — N138 Other obstructive and reflux uropathy: Secondary | ICD-10-CM | POA: Diagnosis not present

## 2018-06-24 DIAGNOSIS — N401 Enlarged prostate with lower urinary tract symptoms: Secondary | ICD-10-CM | POA: Diagnosis not present

## 2018-06-24 DIAGNOSIS — N138 Other obstructive and reflux uropathy: Secondary | ICD-10-CM | POA: Diagnosis not present

## 2018-07-31 DIAGNOSIS — Z79899 Other long term (current) drug therapy: Secondary | ICD-10-CM | POA: Diagnosis not present

## 2018-07-31 DIAGNOSIS — Z0000 Encounter for general adult medical examination without abnormal findings: Secondary | ICD-10-CM | POA: Diagnosis not present

## 2018-07-31 DIAGNOSIS — Z23 Encounter for immunization: Secondary | ICD-10-CM | POA: Diagnosis not present

## 2018-07-31 DIAGNOSIS — R7301 Impaired fasting glucose: Secondary | ICD-10-CM | POA: Diagnosis not present

## 2018-07-31 DIAGNOSIS — Z136 Encounter for screening for cardiovascular disorders: Secondary | ICD-10-CM | POA: Diagnosis not present

## 2018-07-31 DIAGNOSIS — Z1211 Encounter for screening for malignant neoplasm of colon: Secondary | ICD-10-CM | POA: Diagnosis not present

## 2019-01-28 DIAGNOSIS — Z23 Encounter for immunization: Secondary | ICD-10-CM | POA: Diagnosis not present

## 2019-06-10 ENCOUNTER — Ambulatory Visit: Payer: Medicare Other | Attending: Internal Medicine

## 2019-06-10 DIAGNOSIS — Z23 Encounter for immunization: Secondary | ICD-10-CM

## 2019-06-10 NOTE — Progress Notes (Signed)
   Covid-19 Vaccination Clinic  Name:  Darren Young    MRN: [id number] DOB: 28-Feb-1951  06/10/2019  Darren Young was observed post Covid-19 immunization for 15 minutes without incidence. He was provided with Vaccine Information Sheet and instruction to access the V-Safe system.   Darren Young was instructed to call 911 with any severe reactions post vaccine: Marland Kitchen Difficulty breathing  . Swelling of your face and throat  . A fast heartbeat  . A bad rash all over your body  . Dizziness and weakness    Immunizations Administered    Name Date Dose VIS Date Route   Pfizer COVID-19 Vaccine 06/10/2019  9:30 AM 0.3 mL 05/01/2019 Intramuscular   Manufacturer: [place]   Lot: [id number]   [place]: [id number]

## 2019-06-28 ENCOUNTER — Ambulatory Visit: Payer: Medicare Other | Attending: Internal Medicine

## 2019-06-28 DIAGNOSIS — Z23 Encounter for immunization: Secondary | ICD-10-CM | POA: Insufficient documentation

## 2019-06-28 NOTE — Progress Notes (Signed)
   Covid-19 Vaccination Clinic  Name:  Darren Young    MRN: [id number] DOB: 07-01-1950  06/28/2019  [name] was observed post Covid-19 immunization for 15 minutes without incidence. He was provided with Vaccine Information Sheet and instruction to access the V-Safe system.   [name] was instructed to call 911 with any severe reactions post vaccine: Marland Kitchen Difficulty breathing  . Swelling of your face and throat  . A fast heartbeat  . A bad rash all over your body  . Dizziness and weakness    Immunizations Administered    Name Date Dose VIS Date Route   Pfizer COVID-19 Vaccine 06/28/2019 12:28 PM 0.3 mL 05/01/2019 Intramuscular   Manufacturer: [place]   Lot: [id number]   [place]: [id number]

## 2019-06-29 DIAGNOSIS — N5201 Erectile dysfunction due to arterial insufficiency: Secondary | ICD-10-CM | POA: Diagnosis not present

## 2019-06-29 DIAGNOSIS — N401 Enlarged prostate with lower urinary tract symptoms: Secondary | ICD-10-CM | POA: Diagnosis not present

## 2019-06-29 DIAGNOSIS — N35014 Post-traumatic urethral stricture, male, unspecified: Secondary | ICD-10-CM | POA: Diagnosis not present

## 2019-08-06 DIAGNOSIS — R030 Elevated blood-pressure reading, without diagnosis of hypertension: Secondary | ICD-10-CM | POA: Diagnosis not present

## 2019-08-06 DIAGNOSIS — E7800 Pure hypercholesterolemia, unspecified: Secondary | ICD-10-CM | POA: Diagnosis not present

## 2019-08-06 DIAGNOSIS — Z0001 Encounter for general adult medical examination with abnormal findings: Secondary | ICD-10-CM | POA: Diagnosis not present

## 2019-08-06 DIAGNOSIS — Z79899 Other long term (current) drug therapy: Secondary | ICD-10-CM | POA: Diagnosis not present

## 2019-08-06 DIAGNOSIS — R7309 Other abnormal glucose: Secondary | ICD-10-CM | POA: Diagnosis not present

## 2020-01-27 DIAGNOSIS — Z23 Encounter for immunization: Secondary | ICD-10-CM | POA: Diagnosis not present

## 2020-02-23 ENCOUNTER — Ambulatory Visit: Payer: Medicare Other | Attending: Internal Medicine

## 2020-02-23 DIAGNOSIS — Z23 Encounter for immunization: Secondary | ICD-10-CM

## 2020-02-23 NOTE — Progress Notes (Signed)
   Covid-19 Vaccination Clinic  Name:  Darren Young    MRN: [id number] DOB: 09-Jan-1951  02/23/2020  [name] was observed post Covid-19 immunization for 15 minutes without incident. He was provided with Vaccine Information Sheet and instruction to access the V-Safe system.   [name] was instructed to call 911 with any severe reactions post vaccine: Marland Kitchen Difficulty breathing  . Swelling of face and throat  . A fast heartbeat  . A bad rash all over body  . Dizziness and weakness

## 2020-03-11 DIAGNOSIS — H52223 Regular astigmatism, bilateral: Secondary | ICD-10-CM | POA: Diagnosis not present

## 2020-03-11 DIAGNOSIS — H5203 Hypermetropia, bilateral: Secondary | ICD-10-CM | POA: Diagnosis not present

## 2020-03-11 DIAGNOSIS — H18419 Arcus senilis, unspecified eye: Secondary | ICD-10-CM | POA: Diagnosis not present

## 2020-03-11 DIAGNOSIS — H25093 Other age-related incipient cataract, bilateral: Secondary | ICD-10-CM | POA: Diagnosis not present

## 2020-03-11 DIAGNOSIS — H524 Presbyopia: Secondary | ICD-10-CM | POA: Diagnosis not present

## 2020-07-07 DIAGNOSIS — N35014 Post-traumatic urethral stricture, male, unspecified: Secondary | ICD-10-CM | POA: Diagnosis not present

## 2020-07-07 DIAGNOSIS — N5201 Erectile dysfunction due to arterial insufficiency: Secondary | ICD-10-CM | POA: Diagnosis not present

## 2020-07-07 DIAGNOSIS — R8290 Unspecified abnormal findings in urine: Secondary | ICD-10-CM | POA: Diagnosis not present

## 2020-07-07 DIAGNOSIS — N138 Other obstructive and reflux uropathy: Secondary | ICD-10-CM | POA: Diagnosis not present

## 2020-07-07 DIAGNOSIS — N401 Enlarged prostate with lower urinary tract symptoms: Secondary | ICD-10-CM | POA: Diagnosis not present

## 2020-08-16 DIAGNOSIS — E7800 Pure hypercholesterolemia, unspecified: Secondary | ICD-10-CM | POA: Diagnosis not present

## 2020-08-16 DIAGNOSIS — L84 Corns and callosities: Secondary | ICD-10-CM | POA: Diagnosis not present

## 2020-08-16 DIAGNOSIS — Z79899 Other long term (current) drug therapy: Secondary | ICD-10-CM | POA: Diagnosis not present

## 2020-08-16 DIAGNOSIS — Z0001 Encounter for general adult medical examination with abnormal findings: Secondary | ICD-10-CM | POA: Diagnosis not present

## 2020-09-28 DIAGNOSIS — E7800 Pure hypercholesterolemia, unspecified: Secondary | ICD-10-CM | POA: Diagnosis not present

## 2020-09-29 DIAGNOSIS — Z1211 Encounter for screening for malignant neoplasm of colon: Secondary | ICD-10-CM | POA: Diagnosis not present

## 2020-10-12 DIAGNOSIS — Z23 Encounter for immunization: Secondary | ICD-10-CM | POA: Diagnosis not present

## 2021-02-22 DIAGNOSIS — Z23 Encounter for immunization: Secondary | ICD-10-CM | POA: Diagnosis not present

## 2021-03-29 DIAGNOSIS — Z23 Encounter for immunization: Secondary | ICD-10-CM | POA: Diagnosis not present

## 2021-06-07 DIAGNOSIS — L812 Freckles: Secondary | ICD-10-CM | POA: Diagnosis not present

## 2021-06-07 DIAGNOSIS — D225 Melanocytic nevi of trunk: Secondary | ICD-10-CM | POA: Diagnosis not present

## 2021-06-07 DIAGNOSIS — D1801 Hemangioma of skin and subcutaneous tissue: Secondary | ICD-10-CM | POA: Diagnosis not present

## 2021-06-07 DIAGNOSIS — L821 Other seborrheic keratosis: Secondary | ICD-10-CM | POA: Diagnosis not present

## 2021-06-07 DIAGNOSIS — C44619 Basal cell carcinoma of skin of left upper limb, including shoulder: Secondary | ICD-10-CM | POA: Diagnosis not present

## 2021-06-07 DIAGNOSIS — L570 Actinic keratosis: Secondary | ICD-10-CM | POA: Diagnosis not present

## 2021-06-07 DIAGNOSIS — D485 Neoplasm of uncertain behavior of skin: Secondary | ICD-10-CM | POA: Diagnosis not present

## 2021-07-13 DIAGNOSIS — N138 Other obstructive and reflux uropathy: Secondary | ICD-10-CM | POA: Diagnosis not present

## 2021-07-13 DIAGNOSIS — N5201 Erectile dysfunction due to arterial insufficiency: Secondary | ICD-10-CM | POA: Diagnosis not present

## 2021-07-13 DIAGNOSIS — N35013 Post-traumatic anterior urethral stricture: Secondary | ICD-10-CM | POA: Diagnosis not present

## 2021-07-13 DIAGNOSIS — N401 Enlarged prostate with lower urinary tract symptoms: Secondary | ICD-10-CM | POA: Diagnosis not present

## 2021-07-13 DIAGNOSIS — N35014 Post-traumatic urethral stricture, male, unspecified: Secondary | ICD-10-CM | POA: Diagnosis not present

## 2021-08-21 DIAGNOSIS — R7309 Other abnormal glucose: Secondary | ICD-10-CM | POA: Diagnosis not present

## 2021-08-21 DIAGNOSIS — E7800 Pure hypercholesterolemia, unspecified: Secondary | ICD-10-CM | POA: Diagnosis not present

## 2021-08-21 DIAGNOSIS — Z0000 Encounter for general adult medical examination without abnormal findings: Secondary | ICD-10-CM | POA: Diagnosis not present

## 2021-08-21 DIAGNOSIS — Z79899 Other long term (current) drug therapy: Secondary | ICD-10-CM | POA: Diagnosis not present

## 2021-08-21 DIAGNOSIS — Z125 Encounter for screening for malignant neoplasm of prostate: Secondary | ICD-10-CM | POA: Diagnosis not present

## 2021-08-23 DIAGNOSIS — Z0001 Encounter for general adult medical examination with abnormal findings: Secondary | ICD-10-CM | POA: Diagnosis not present

## 2021-08-23 DIAGNOSIS — Z1211 Encounter for screening for malignant neoplasm of colon: Secondary | ICD-10-CM | POA: Diagnosis not present

## 2021-08-23 DIAGNOSIS — E7800 Pure hypercholesterolemia, unspecified: Secondary | ICD-10-CM | POA: Diagnosis not present

## 2021-08-23 DIAGNOSIS — H6123 Impacted cerumen, bilateral: Secondary | ICD-10-CM | POA: Diagnosis not present

## 2021-08-23 DIAGNOSIS — R7303 Prediabetes: Secondary | ICD-10-CM | POA: Diagnosis not present

## 2021-11-29 DIAGNOSIS — Z85828 Personal history of other malignant neoplasm of skin: Secondary | ICD-10-CM | POA: Diagnosis not present

## 2021-11-29 DIAGNOSIS — L905 Scar conditions and fibrosis of skin: Secondary | ICD-10-CM | POA: Diagnosis not present

## 2022-02-26 DIAGNOSIS — Z23 Encounter for immunization: Secondary | ICD-10-CM | POA: Diagnosis not present

## 2022-04-05 DIAGNOSIS — M21862 Other specified acquired deformities of left lower leg: Secondary | ICD-10-CM | POA: Diagnosis not present

## 2022-04-05 DIAGNOSIS — M792 Neuralgia and neuritis, unspecified: Secondary | ICD-10-CM | POA: Diagnosis not present

## 2022-04-05 DIAGNOSIS — M25372 Other instability, left ankle: Secondary | ICD-10-CM | POA: Diagnosis not present

## 2022-04-05 DIAGNOSIS — M722 Plantar fascial fibromatosis: Secondary | ICD-10-CM | POA: Diagnosis not present

## 2022-05-04 DIAGNOSIS — M21862 Other specified acquired deformities of left lower leg: Secondary | ICD-10-CM | POA: Diagnosis not present

## 2022-05-04 DIAGNOSIS — M792 Neuralgia and neuritis, unspecified: Secondary | ICD-10-CM | POA: Diagnosis not present

## 2022-05-04 DIAGNOSIS — M25372 Other instability, left ankle: Secondary | ICD-10-CM | POA: Diagnosis not present

## 2022-05-04 DIAGNOSIS — M722 Plantar fascial fibromatosis: Secondary | ICD-10-CM | POA: Diagnosis not present

## 2022-06-04 DIAGNOSIS — C44519 Basal cell carcinoma of skin of other part of trunk: Secondary | ICD-10-CM | POA: Diagnosis not present

## 2022-06-04 DIAGNOSIS — D225 Melanocytic nevi of trunk: Secondary | ICD-10-CM | POA: Diagnosis not present

## 2022-06-04 DIAGNOSIS — D485 Neoplasm of uncertain behavior of skin: Secondary | ICD-10-CM | POA: Diagnosis not present

## 2022-06-04 DIAGNOSIS — Z85828 Personal history of other malignant neoplasm of skin: Secondary | ICD-10-CM | POA: Diagnosis not present

## 2022-06-04 DIAGNOSIS — L812 Freckles: Secondary | ICD-10-CM | POA: Diagnosis not present

## 2022-06-04 DIAGNOSIS — L570 Actinic keratosis: Secondary | ICD-10-CM | POA: Diagnosis not present

## 2022-06-04 DIAGNOSIS — L821 Other seborrheic keratosis: Secondary | ICD-10-CM | POA: Diagnosis not present

## 2022-07-06 DIAGNOSIS — M21862 Other specified acquired deformities of left lower leg: Secondary | ICD-10-CM | POA: Diagnosis not present

## 2022-07-06 DIAGNOSIS — M25372 Other instability, left ankle: Secondary | ICD-10-CM | POA: Diagnosis not present

## 2022-07-06 DIAGNOSIS — M792 Neuralgia and neuritis, unspecified: Secondary | ICD-10-CM | POA: Diagnosis not present

## 2022-07-06 DIAGNOSIS — M722 Plantar fascial fibromatosis: Secondary | ICD-10-CM | POA: Diagnosis not present

## 2022-07-19 DIAGNOSIS — N401 Enlarged prostate with lower urinary tract symptoms: Secondary | ICD-10-CM | POA: Diagnosis not present

## 2022-07-19 DIAGNOSIS — N138 Other obstructive and reflux uropathy: Secondary | ICD-10-CM | POA: Diagnosis not present

## 2022-07-19 DIAGNOSIS — N529 Male erectile dysfunction, unspecified: Secondary | ICD-10-CM | POA: Diagnosis not present

## 2022-07-19 DIAGNOSIS — N35014 Post-traumatic urethral stricture, male, unspecified: Secondary | ICD-10-CM | POA: Diagnosis not present

## 2022-08-07 DIAGNOSIS — H5203 Hypermetropia, bilateral: Secondary | ICD-10-CM | POA: Diagnosis not present

## 2022-08-07 DIAGNOSIS — Z135 Encounter for screening for eye and ear disorders: Secondary | ICD-10-CM | POA: Diagnosis not present

## 2022-08-07 DIAGNOSIS — H2513 Age-related nuclear cataract, bilateral: Secondary | ICD-10-CM | POA: Diagnosis not present

## 2022-12-14 DIAGNOSIS — Z03818 Encounter for observation for suspected exposure to other biological agents ruled out: Secondary | ICD-10-CM | POA: Diagnosis not present

## 2022-12-14 DIAGNOSIS — R059 Cough, unspecified: Secondary | ICD-10-CM | POA: Diagnosis not present

## 2022-12-14 DIAGNOSIS — R634 Abnormal weight loss: Secondary | ICD-10-CM | POA: Diagnosis not present

## 2022-12-31 DIAGNOSIS — E7800 Pure hypercholesterolemia, unspecified: Secondary | ICD-10-CM | POA: Diagnosis not present

## 2022-12-31 DIAGNOSIS — Z125 Encounter for screening for malignant neoplasm of prostate: Secondary | ICD-10-CM | POA: Diagnosis not present

## 2022-12-31 DIAGNOSIS — Z79899 Other long term (current) drug therapy: Secondary | ICD-10-CM | POA: Diagnosis not present

## 2022-12-31 DIAGNOSIS — R7301 Impaired fasting glucose: Secondary | ICD-10-CM | POA: Diagnosis not present

## 2023-01-01 DIAGNOSIS — Z125 Encounter for screening for malignant neoplasm of prostate: Secondary | ICD-10-CM | POA: Diagnosis not present

## 2023-01-01 DIAGNOSIS — Z79899 Other long term (current) drug therapy: Secondary | ICD-10-CM | POA: Diagnosis not present

## 2023-01-01 DIAGNOSIS — E7800 Pure hypercholesterolemia, unspecified: Secondary | ICD-10-CM | POA: Diagnosis not present

## 2023-01-01 DIAGNOSIS — Z0001 Encounter for general adult medical examination with abnormal findings: Secondary | ICD-10-CM | POA: Diagnosis not present

## 2023-01-01 DIAGNOSIS — R7301 Impaired fasting glucose: Secondary | ICD-10-CM | POA: Diagnosis not present

## 2023-01-01 DIAGNOSIS — Z1211 Encounter for screening for malignant neoplasm of colon: Secondary | ICD-10-CM | POA: Diagnosis not present

## 2023-07-01 DIAGNOSIS — L821 Other seborrheic keratosis: Secondary | ICD-10-CM | POA: Diagnosis not present

## 2023-07-01 DIAGNOSIS — Z85828 Personal history of other malignant neoplasm of skin: Secondary | ICD-10-CM | POA: Diagnosis not present

## 2023-07-01 DIAGNOSIS — L570 Actinic keratosis: Secondary | ICD-10-CM | POA: Diagnosis not present

## 2023-07-01 DIAGNOSIS — D1801 Hemangioma of skin and subcutaneous tissue: Secondary | ICD-10-CM | POA: Diagnosis not present

## 2023-07-01 DIAGNOSIS — D225 Melanocytic nevi of trunk: Secondary | ICD-10-CM | POA: Diagnosis not present

## 2023-07-01 DIAGNOSIS — L853 Xerosis cutis: Secondary | ICD-10-CM | POA: Diagnosis not present

## 2023-07-01 DIAGNOSIS — L812 Freckles: Secondary | ICD-10-CM | POA: Diagnosis not present

## 2023-07-25 DIAGNOSIS — N5201 Erectile dysfunction due to arterial insufficiency: Secondary | ICD-10-CM | POA: Diagnosis not present

## 2023-07-25 DIAGNOSIS — N35014 Post-traumatic urethral stricture, male, unspecified: Secondary | ICD-10-CM | POA: Diagnosis not present

## 2023-12-25 DIAGNOSIS — Z23 Encounter for immunization: Secondary | ICD-10-CM | POA: Diagnosis not present
